# Patient Record
Sex: Female | Born: 1955 | Race: Black or African American | Hispanic: No | Smoking: Never smoker
Health system: Southern US, Community
[De-identification: ages and names within clinical notes are randomized; demographics above are authoritative.]

## PROBLEM LIST (undated history)

## (undated) DIAGNOSIS — M1990 Unspecified osteoarthritis, unspecified site: Secondary | ICD-10-CM

## (undated) DIAGNOSIS — I10 Essential (primary) hypertension: Secondary | ICD-10-CM

## (undated) DIAGNOSIS — T8859XA Other complications of anesthesia, initial encounter: Secondary | ICD-10-CM

## (undated) DIAGNOSIS — Z9889 Other specified postprocedural states: Secondary | ICD-10-CM

## (undated) DIAGNOSIS — Z87442 Personal history of urinary calculi: Secondary | ICD-10-CM

## (undated) DIAGNOSIS — Z96642 Presence of left artificial hip joint: Secondary | ICD-10-CM

## (undated) HISTORY — PX: ABDOMINAL HYSTERECTOMY: SHX81

## (undated) HISTORY — DX: Presence of left artificial hip joint: Z96.642

---

## 2009-07-08 DIAGNOSIS — Z96642 Presence of left artificial hip joint: Secondary | ICD-10-CM

## 2009-07-08 HISTORY — DX: Presence of left artificial hip joint: Z96.642

## 2011-01-27 ENCOUNTER — Inpatient Hospital Stay: Payer: Self-pay | Admitting: Internal Medicine

## 2011-02-01 ENCOUNTER — Emergency Department: Payer: Self-pay | Admitting: Emergency Medicine

## 2012-09-02 ENCOUNTER — Ambulatory Visit: Payer: Self-pay

## 2012-12-14 ENCOUNTER — Emergency Department: Payer: Self-pay | Admitting: Emergency Medicine

## 2012-12-14 LAB — CBC
HCT: 41.6 % (ref 35.0–47.0)
HGB: 14.3 g/dL (ref 12.0–16.0)
MCH: 26.8 pg (ref 26.0–34.0)
MCHC: 34.4 g/dL (ref 32.0–36.0)
MCV: 78 fL — ABNORMAL LOW (ref 80–100)
Platelet: 245 10*3/uL (ref 150–440)
RBC: 5.34 10*6/uL — ABNORMAL HIGH (ref 3.80–5.20)
RDW: 13.8 % (ref 11.5–14.5)
WBC: 6.4 10*3/uL (ref 3.6–11.0)

## 2012-12-14 LAB — URINALYSIS, COMPLETE
Bacteria: NONE SEEN
Bilirubin,UR: NEGATIVE
Blood: NEGATIVE
Glucose,UR: NEGATIVE mg/dL (ref 0–75)
Ketone: NEGATIVE
Leukocyte Esterase: NEGATIVE
Nitrite: NEGATIVE
Ph: 9 (ref 4.5–8.0)
Protein: NEGATIVE
RBC,UR: <1 /HPF (ref 0–5)
Specific Gravity: 1.005 (ref 1.003–1.030)
Squamous Epithelial: <1
WBC UR: 1 /HPF (ref 0–5)

## 2012-12-14 LAB — COMPREHENSIVE METABOLIC PANEL
Albumin: 3.9 g/dL (ref 3.4–5.0)
Alkaline Phosphatase: 112 U/L (ref 50–136)
Anion Gap: 9 (ref 7–16)
BUN: 12 mg/dL (ref 7–18)
Bilirubin,Total: 0.7 mg/dL (ref 0.2–1.0)
Calcium, Total: 9.6 mg/dL (ref 8.5–10.1)
Chloride: 110 mmol/L — ABNORMAL HIGH (ref 98–107)
Co2: 23 mmol/L (ref 21–32)
Creatinine: 0.83 mg/dL (ref 0.60–1.30)
EGFR (African American): >60
EGFR (Non-African Amer.): >60
Glucose: 102 mg/dL — ABNORMAL HIGH (ref 65–99)
Osmolality: 283 (ref 275–301)
Potassium: 3.4 mmol/L — ABNORMAL LOW (ref 3.5–5.1)
SGOT(AST): 19 U/L (ref 15–37)
SGPT (ALT): 14 U/L (ref 12–78)
Sodium: 142 mmol/L (ref 136–145)
Total Protein: 8 g/dL (ref 6.4–8.2)

## 2012-12-14 LAB — TROPONIN I
Troponin-I: <0.02 ng/mL
Troponin-I: <0.02 ng/mL

## 2013-03-02 ENCOUNTER — Other Ambulatory Visit: Payer: Self-pay | Admitting: *Deleted

## 2013-07-08 HISTORY — PX: JOINT REPLACEMENT: SHX530

## 2013-09-03 ENCOUNTER — Ambulatory Visit: Payer: Self-pay | Admitting: Family Medicine

## 2013-12-14 ENCOUNTER — Ambulatory Visit: Payer: Self-pay | Admitting: Family Medicine

## 2014-04-13 DIAGNOSIS — M160 Bilateral primary osteoarthritis of hip: Secondary | ICD-10-CM | POA: Insufficient documentation

## 2014-04-13 DIAGNOSIS — M25559 Pain in unspecified hip: Secondary | ICD-10-CM

## 2014-04-13 HISTORY — DX: Bilateral primary osteoarthritis of hip: M16.0

## 2014-04-13 HISTORY — DX: Pain in unspecified hip: M25.559

## 2014-04-27 ENCOUNTER — Ambulatory Visit: Payer: Self-pay | Admitting: Orthopedic Surgery

## 2014-04-27 LAB — URINALYSIS, COMPLETE
Bilirubin,UR: NEGATIVE
Blood: NEGATIVE
Glucose,UR: NEGATIVE mg/dL (ref 0–75)
Ketone: NEGATIVE
Nitrite: NEGATIVE
Ph: 5 (ref 4.5–8.0)
Protein: NEGATIVE
RBC,UR: 1 /HPF (ref 0–5)
Specific Gravity: 1.019 (ref 1.003–1.030)
Squamous Epithelial: 4
WBC UR: 19 /HPF (ref 0–5)

## 2014-04-27 LAB — BASIC METABOLIC PANEL
Anion Gap: 7 (ref 7–16)
BUN: 16 mg/dL (ref 7–18)
Calcium, Total: 8.9 mg/dL (ref 8.5–10.1)
Chloride: 108 mmol/L — ABNORMAL HIGH (ref 98–107)
Co2: 27 mmol/L (ref 21–32)
Creatinine: 0.75 mg/dL (ref 0.60–1.30)
EGFR (African American): >60
EGFR (Non-African Amer.): >60
Glucose: 94 mg/dL (ref 65–99)
Osmolality: 284 (ref 275–301)
Potassium: 3.9 mmol/L (ref 3.5–5.1)
Sodium: 142 mmol/L (ref 136–145)

## 2014-04-27 LAB — CBC
HCT: 39.2 % (ref 35.0–47.0)
HGB: 12.3 g/dL (ref 12.0–16.0)
MCH: 26.2 pg (ref 26.0–34.0)
MCHC: 31.5 g/dL — ABNORMAL LOW (ref 32.0–36.0)
MCV: 83 fL (ref 80–100)
Platelet: 269 10*3/uL (ref 150–440)
RBC: 4.71 10*6/uL (ref 3.80–5.20)
RDW: 13.7 % (ref 11.5–14.5)
WBC: 5 10*3/uL (ref 3.6–11.0)

## 2014-04-27 LAB — PROTIME-INR
INR: 1
Prothrombin Time: 12.6 secs (ref 11.5–14.7)

## 2014-04-27 LAB — APTT: Activated PTT: 29.9 secs (ref 23.6–35.9)

## 2014-04-27 LAB — SEDIMENTATION RATE: Erythrocyte Sed Rate: 17 mm/hr (ref 0–30)

## 2014-04-27 LAB — MRSA PCR SCREENING

## 2014-05-10 ENCOUNTER — Inpatient Hospital Stay: Payer: Self-pay | Admitting: Orthopedic Surgery

## 2014-05-11 LAB — BASIC METABOLIC PANEL
Anion Gap: 7 (ref 7–16)
BUN: 10 mg/dL (ref 7–18)
Calcium, Total: 8.2 mg/dL — ABNORMAL LOW (ref 8.5–10.1)
Chloride: 107 mmol/L (ref 98–107)
Co2: 27 mmol/L (ref 21–32)
Creatinine: 0.74 mg/dL (ref 0.60–1.30)
EGFR (African American): >60
EGFR (Non-African Amer.): >60
Glucose: 102 mg/dL — ABNORMAL HIGH (ref 65–99)
Osmolality: 280 (ref 275–301)
Potassium: 3.7 mmol/L (ref 3.5–5.1)
Sodium: 141 mmol/L (ref 136–145)

## 2014-05-11 LAB — HEMOGLOBIN: HGB: 11.3 g/dL — ABNORMAL LOW (ref 12.0–16.0)

## 2014-05-11 LAB — PLATELET COUNT: Platelet: 179 10*3/uL (ref 150–440)

## 2014-05-15 ENCOUNTER — Emergency Department: Payer: Self-pay | Admitting: Internal Medicine

## 2014-05-15 ENCOUNTER — Ambulatory Visit: Payer: Self-pay | Admitting: Orthopedic Surgery

## 2014-05-15 LAB — URINALYSIS, COMPLETE
Bilirubin,UR: NEGATIVE
Blood: NEGATIVE
Glucose,UR: NEGATIVE mg/dL (ref 0–75)
Nitrite: NEGATIVE
Ph: 8 (ref 4.5–8.0)
Protein: 30
RBC,UR: 1 /HPF (ref 0–5)
Specific Gravity: 1.017 (ref 1.003–1.030)
Squamous Epithelial: 3
WBC UR: 53 /HPF (ref 0–5)

## 2014-05-15 LAB — COMPREHENSIVE METABOLIC PANEL
Albumin: 2.6 g/dL — ABNORMAL LOW (ref 3.4–5.0)
Alkaline Phosphatase: 114 U/L
Anion Gap: 12 (ref 7–16)
BUN: 12 mg/dL (ref 7–18)
Bilirubin,Total: 1.2 mg/dL — ABNORMAL HIGH (ref 0.2–1.0)
Calcium, Total: 8.6 mg/dL (ref 8.5–10.1)
Chloride: 106 mmol/L (ref 98–107)
Co2: 21 mmol/L (ref 21–32)
Creatinine: 0.71 mg/dL (ref 0.60–1.30)
EGFR (African American): >60
EGFR (Non-African Amer.): >60
Glucose: 85 mg/dL (ref 65–99)
Osmolality: 277 (ref 275–301)
Potassium: 3 mmol/L — ABNORMAL LOW (ref 3.5–5.1)
SGOT(AST): 39 U/L — ABNORMAL HIGH (ref 15–37)
SGPT (ALT): 23 U/L
Sodium: 139 mmol/L (ref 136–145)
Total Protein: 7.3 g/dL (ref 6.4–8.2)

## 2014-05-15 LAB — CBC
HCT: 31.8 % — ABNORMAL LOW (ref 35.0–47.0)
HGB: 10.7 g/dL — ABNORMAL LOW (ref 12.0–16.0)
MCH: 27.7 pg (ref 26.0–34.0)
MCHC: 33.5 g/dL (ref 32.0–36.0)
MCV: 83 fL (ref 80–100)
Platelet: 307 10*3/uL (ref 150–440)
RBC: 3.86 10*6/uL (ref 3.80–5.20)
RDW: 13.6 % (ref 11.5–14.5)
WBC: 10.1 10*3/uL (ref 3.6–11.0)

## 2014-10-27 ENCOUNTER — Ambulatory Visit
Admit: 2014-10-27 | Disposition: A | Discharge status: Home / Self Care | Payer: Self-pay | Attending: Nurse Practitioner | Admitting: Nurse Practitioner

## 2014-10-29 NOTE — Consult Note (Signed)
Brief Consult Note: Diagnosis: Right hip pain s/p THA.   Patient was seen by consultant.   Comments: 59 year old female with right hip pain s/p DA-THA. She had missed a few of her pain medicaitons at her facility and had been having increased pain. Also, the SNF was worried that her wound looked redder.  On exam, she has normal peri-incisional erythema for her timepoint out from surgery. Nothing abnormal. She has active and passive ROM of the hip from 45 of flexion to 0 of extension. Abduction to 30 and adduction to 20. No pain.   I think that this is just her being a little tender from surgery and not getting appropriate pain medicaitons. Her exam is completely benign. I think that she can continue with wBAT, AAT and resume her post-op instructions as normal. She may follow-up with Dr. Rudene Christians as scheduled.   30 minutes spent on this consult with 20 of those face to face counselling and reassuring.  Electronic Signatures: Mardene Sayer (MD)  (Signed 28-Dec-15 00:21)  Authored: Brief Consult Note   Last Updated: 28-Dec-15 00:21 by Mardene Sayer (MD)

## 2014-10-29 NOTE — Op Note (Signed)
PATIENT NAME:  Brenda Thornton, Brenda Thornton MR#:  [id number] DATE OF BIRTH:  01-Nov-1955  DATE OF PROCEDURE:  05/10/2014  PREOPERATIVE DIAGNOSIS:  Severe left hip osteoarthritis.   POSTOPERATIVE DIAGNOSIS:  Severe left hip osteoarthritis.   PROCEDURE:  Left total hip replacement, anterior approach.   SURGEON:  Laurene Footman, MD   ANESTHESIA:  Spinal.   DESCRIPTION OF PROCEDURE:  The patient was brought to the operating room, and after spinal anesthesia was obtained, the patient was placed on the operative table with the right leg on a well-padded table, left foot in the Medacta attachment. Initial C-arm view was obtained, and the hip was prepped and draped in the usual sterile fashion. After patient identification and timeout procedures were completed, incision was made centered over the greater trochanter and tensor fasciae latae muscle. Skin and subcutaneous tissue was incised and the tensor fasciae incised. The tensor muscle was retracted laterally. The deep fascia was incised and the lateral femoral circumflex vessels ligated. The anterior capsule was then exposed and a capsulotomy created. The neck cut was carried out, and the head was removed showing severe osteoarthritis. The cup acetabulum also showed eburnated bone. There were spurs laterally and medially, which were subsequently debrided after reaming and placing of the cup. Reaming was carried out sequentially up to 52 mm, at which point there was good bleeding bone. A 52 mm trial fit well and the 52 mm cup impacted into place. Next, external rotation was carried out, pubofemoral and ischiofemoral releases, and the leg was dropped into extension. Sequential broaching was carried up to a size 7 Amis stem. With trials, L-sized head was chosen. The 7 Amis stem was impacted into place. The dual mobility liner associated with the 52 mm cup was assembled with the L 28 mm head. This was impacted onto the stem and the hip was reduced and was stable to 90  degrees external rotation. X-ray showed good component position on comparison to preoperative template with removal of large spurs. The wound was thoroughly irrigated and infiltrated with 30 mL of 0.25% Sensorcaine with epinephrine. The deep fascia was repaired using a running Quill suture. A subcutaneous drain was placed followed by 2-0 Quill subcutaneously and skin staples. Xeroform, 4 x 4's, ABD, and tape were applied. The patient was sent to the recovery room in stable condition.   ESTIMATED BLOOD LOSS:  400 mL.   COMPLICATIONS:  None.   SPECIMENS:  Femoral head.   IMPLANTS:  Medacta Versafitcup DM 52 mm with liner, an L 28 mm femoral head, and a 7 standard Amis stem, all components uncemented.    ____________________________ Laurene Footman, MD [email or web address] D: 05/10/2014 18:33:21 ET T: 05/11/2014 05:22:30 ET JOB#: [id number]  cc: Laurene Footman, MD, <Dictator> Laurene Footman MD ELECTRONICALLY SIGNED 05/11/2014 8:43

## 2014-10-29 NOTE — Discharge Summary (Signed)
PATIENT NAME:  Brenda Thornton, Brenda Thornton MR#:  [id number] DATE OF BIRTH:  Feb 03, 1956  DATE OF ADMISSION:  05/10/2014 DATE OF DISCHARGE:  05/13/2014  ADMITTING DIAGNOSIS: Degenerative arthritis, left hip.  DISCHARGE DIAGNOSIS: Degenerative arthritis, left hip.   SURGERY: On 05/10/2014, had a left total hip arthroplasty.   SURGEON: Laurene Footman, M.D.   ANESTHESIA: Spinal.   ESTIMATED BLOOD LOSS: 400 mL.   DRAINS: Hemovac.   IMPLANTS: Medacta 7 AMIS stem, 52 mm Versafit cup DM, L 28 mm head.  COMPLICATIONS: None.   HISTORY: Brenda Thornton is a 59 year old female, who failed conservative measures and treatment for her left hip degenerative arthritis. She agreed to elective total hip arthroplasty.   PHYSICAL EXAMINATION:  GENERAL: Well developed, well nourished. No acute distress.  RESPIRATORY: No use of accessory muscles.  GASTROINTESTINAL: Nausea, vomiting, no. Constipation, yes. Tolerating a diet.  PHYSICAL THERAPY: 15 feet in room with walker. INCISION: Left anterior hip, no drainage. Dressing clean and intact.  NEUROVASCULAR: Intact. Homans sign negative.  MUSCULOSKELETAL: 3/5 straight leg raise, no pain with passive hip range of motion; 5/5 ankle dorsiflexion and plantarflexion. Thigh soft.   HOSPITAL COURSE: After initial admission on 05/10/2014, the patient underwent left total hip arthroplasty the same day. She had good pain control afterwards and was transported to the orthopedic floor. On postoperative day one, 05/11/2014, hemoglobin 11.3. Platelets 179,000. Hemovac output 350 mL, on room air. Physical therapy was begun on that day.   On postoperative day two, 05/12/2014, temperature maximum 101.9, improved quickly with Tylenol. No laboratories. On room air. Hemovac output 140 mL. IV, Foley and Hemovac discontinued this date.   On postoperative day 3, she is stable and ready for discharge. She had moderate pain with physical therapy.   CONDITION AT DISCHARGE: Stable.    DISPOSITION: The patient was sent home with home health physical therapy.   DISCHARGE INSTRUCTIONS: The patient will follow up with [facility] in 2 weeks for staple removal. She will do home health physical therapy and weight bear as tolerated on the bilateral lower extremities. Anterior hip precautions. Knee-high TEDs bilaterally. Regular diet.   Please see discharge instructions for complete list of discharge medications.    ____________________________ Kristofer Schaffert M. Tretha Sciara, NP amb:JT D: 05/30/2014 08:45:37 ET T: 05/30/2014 09:32:59 ET JOB#: [id number]  cc: Carmilla Granville M. Tretha Sciara, NP, <Dictator> Kem Kays Evo Aderman FNP ELECTRONICALLY SIGNED 06/09/2014 8:55

## 2014-10-31 LAB — SURGICAL PATHOLOGY

## 2016-09-03 ENCOUNTER — Other Ambulatory Visit: Payer: Self-pay | Admitting: Nurse Practitioner

## 2016-09-03 DIAGNOSIS — Z1239 Encounter for other screening for malignant neoplasm of breast: Secondary | ICD-10-CM

## 2016-09-30 ENCOUNTER — Encounter: Payer: Self-pay | Admitting: Emergency Medicine

## 2016-09-30 ENCOUNTER — Emergency Department: Payer: BLUE CROSS/BLUE SHIELD

## 2016-09-30 ENCOUNTER — Emergency Department
Admission: EM | Admit: 2016-09-30 | Discharge: 2016-09-30 | Disposition: A | Discharge status: Home / Self Care | Payer: BLUE CROSS/BLUE SHIELD | Attending: Emergency Medicine | Admitting: Emergency Medicine

## 2016-09-30 DIAGNOSIS — I10 Essential (primary) hypertension: Secondary | ICD-10-CM | POA: Diagnosis not present

## 2016-09-30 DIAGNOSIS — R079 Chest pain, unspecified: Secondary | ICD-10-CM

## 2016-09-30 HISTORY — DX: Essential (primary) hypertension: I10

## 2016-09-30 LAB — BASIC METABOLIC PANEL
Anion gap: 6 (ref 5–15)
BUN: 22 mg/dL — ABNORMAL HIGH (ref 6–20)
CO2: 25 mmol/L (ref 22–32)
Calcium: 9.3 mg/dL (ref 8.9–10.3)
Chloride: 109 mmol/L (ref 101–111)
Creatinine, Ser: 0.65 mg/dL (ref 0.44–1.00)
GFR calc Af Amer: >60 mL/min (ref >60)
GFR calc non Af Amer: >60 mL/min (ref >60)
Glucose, Bld: 95 mg/dL (ref 65–99)
Potassium: 3.7 mmol/L (ref 3.5–5.1)
Sodium: 140 mmol/L (ref 135–145)

## 2016-09-30 LAB — CBC
HCT: 41.6 % (ref 35.0–47.0)
Hemoglobin: 14 g/dL (ref 12.0–16.0)
MCH: 26.4 pg (ref 26.0–34.0)
MCHC: 33.6 g/dL (ref 32.0–36.0)
MCV: 78.5 fL — ABNORMAL LOW (ref 80.0–100.0)
Platelets: 229 10*3/uL (ref 150–440)
RBC: 5.3 MIL/uL — ABNORMAL HIGH (ref 3.80–5.20)
RDW: 14.9 % — ABNORMAL HIGH (ref 11.5–14.5)
WBC: 6.7 10*3/uL (ref 3.6–11.0)

## 2016-09-30 LAB — TROPONIN I: Troponin I: <0.03 ng/mL (ref <0.03)

## 2016-09-30 MED ORDER — ASPIRIN 81 MG PO CHEW
324.0000 mg | CHEWABLE_TABLET | Freq: Once | ORAL | Status: AC
  Administered 2016-09-30: 324 mg via ORAL
  Filled 2016-09-30: qty 4

## 2016-09-30 NOTE — ED Provider Notes (Signed)
[facility] Emergency Department Provider Note  ____________________________________________   First MD Initiated Contact with Patient 09/30/16 1543     (approximate)  I have reviewed the triage vital signs and the nursing notes.   HISTORY  Chief Complaint Chest Pain    HPI Brenda Thornton is a 61 y.o. female who only has a history of recently diagnosed hypertensionwho presents for evaluation of intermittent mild squeezing central and left-sided chest pain that has been occurring intermittently for the last 5 days.  She states that this is a new thing for her.  Nothing in particular makes it better nor worse and she does not associated specifically with exertion.  She denies recent illness.  She denies fever/chills, shortness of breath, nausea, vomiting, abdominal pain, dysuria.  She went to her PCP, the [place] [facility], today, and they started her on blood pressure but then asked her to go to the emergency department for further evaluation of the chest pain.  She does not have a cardiologist and has had no prior heart issues.  She has no history of blood clots in her legs or lungs and has not had any recent surgeries nor been on any long trips.  She is not on exogenous estrogen.  She has no first-degree relatives who have ever had heart attacks.  She has no history of diabetes, hyperlipidemia, nor tobacco use.   Past Medical History:  Diagnosis Date  . Hypertension     There are no active problems to display for this patient.   Past Surgical History:  Procedure Laterality Date  . ABDOMINAL HYSTERECTOMY      Prior to Admission medications   Not on File    Allergies Patient has no known allergies.  No family history on file.  Social History Social History  Substance Use Topics  . Smoking status: Never Smoker  . Smokeless tobacco: Never Used  . Alcohol use Yes    Review of Systems Constitutional: No fever/chills Eyes: No visual  changes. ENT: No sore throat. Cardiovascular: Intermittent squeezing chest pain for 5 days. Respiratory: Denies shortness of breath. Gastrointestinal: No abdominal pain.  No nausea, no vomiting.  No diarrhea.  No constipation. Genitourinary: Negative for dysuria. Musculoskeletal: Negative for back pain. Skin: Negative for rash. Neurological: Negative for headaches, focal weakness or numbness.  10-point ROS otherwise negative.  ____________________________________________   PHYSICAL EXAM:  ED Triage Vitals  Enc Vitals Group     BP 09/30/16 1547 (!) 169/85     Pulse Rate 09/30/16 1547 88     Resp 09/30/16 1547 18     Temp 09/30/16 1547 97.8 F (36.6 C)     Temp Source 09/30/16 1547 Oral     SpO2 09/30/16 1547 99 %     Weight 09/30/16 1120 228 lb (103.4 kg)     Height 09/30/16 1120 5\' 5"  (1.651 m)     Head Circumference --      Peak Flow --      Pain Score 09/30/16 1119 5     Pain Loc --      Pain Edu? --      Excl. in [place]? --      Constitutional: Alert and oriented. Well appearing and in no acute distress. Eyes: Conjunctivae are normal. PERRL. EOMI. Head: Atraumatic. Nose: No congestion/rhinnorhea. Mouth/Throat: Mucous membranes are moist. Neck: No stridor.  No meningeal signs.   Cardiovascular: Normal rate, regular rhythm. Good peripheral circulation. Grossly normal heart sounds. No reproducible chest wall  tenderness to palpation Respiratory: Normal respiratory effort.  No retractions. Lungs CTAB. Gastrointestinal: Soft and nontender. No distention.  Musculoskeletal: No lower extremity tenderness nor edema. No gross deformities of extremities. Neurologic:  Normal speech and language. No gross focal neurologic deficits are appreciated.  Skin:  Skin is warm, dry and intact. No rash noted. Psychiatric: Mood and affect are normal. Speech and behavior are normal.  ____________________________________________   LABS (all labs ordered are listed, but only abnormal results  are displayed)  Labs Reviewed  BASIC METABOLIC PANEL - Abnormal; Notable for the following:       Result Value   BUN 22 (*)    All other components within normal limits  CBC - Abnormal; Notable for the following:    RBC 5.30 (*)    MCV 78.5 (*)    RDW 14.9 (*)    All other components within normal limits  TROPONIN I   ____________________________________________  EKG  ED ECG REPORT I, Quantarius Genrich, the attending physician, personally viewed and interpreted this ECG.  Date: 09/30/2016 EKG Time: 11:07 Rate: 80 Rhythm: normal sinus rhythm QRS Axis: normal Intervals: normal ST/T Wave abnormalities: normal Conduction Disturbances: none Narrative Interpretation: unremarkable  ____________________________________________  RADIOLOGY   Dg Chest 2 View  Result Date: 09/30/2016 CLINICAL DATA:  Chest pain EXAM: CHEST  2 VIEW COMPARISON:  12/14/2012 FINDINGS: The heart size and mediastinal contours are within normal limits. Both lungs are clear. The visualized skeletal structures are unremarkable. IMPRESSION: No active cardiopulmonary disease. Electronically Signed   By: Kerby Moors M.D.   On: 09/30/2016 12:44    ____________________________________________   PROCEDURES  Critical Care performed: No   Procedure(s) performed:   Procedures   ____________________________________________   INITIAL IMPRESSION / ASSESSMENT AND PLAN / ED COURSE  Pertinent labs & imaging results that were available during my care of the patient were reviewed by me and considered in my medical decision making (see chart for details).  HEART score is 3 including subjectively "moderately suspicious" history.  Wells Score for PE is zero, and she has no tachycardia, no hypoxemia, and no shortness of breath.  She is low enough risk for PE that I do not believe a d-dimer would be helpful and likely would send this down the wrong pathway.  Chest x-ray is unremarkable and she has no infectious  symptoms. .  Anginal chest discomfort is most likely, I suspect, but there is no sign of ACS at this point.  I discussed the fact that she is low risk and appropriate for outpatient cardiology follow up.  Given that the symptoms have been present for five days and troponin is negative, and the discomfort is actually better today, there is no indication for a repeat troponin.  Gave full dose ASA, gave close follow up information for Dr. Fletcher Anon.    I gave my usual and customary return precautions.      Clinical Course as of Oct 01 1623  Mon Sep 30, 2016  1623 I spoke by phone with Dr. Fletcher Anon who took down the patient's information and said he will have his office staff reach out to her for an appointment this week.  I updated the patient with this plan.  [CF]    Clinical Course User Index [CF] Hinda Kehr, MD    ____________________________________________  FINAL CLINICAL IMPRESSION(S) / ED DIAGNOSES  Final diagnoses:  Chest pain, unspecified type     MEDICATIONS GIVEN DURING THIS VISIT:  Medications  aspirin chewable tablet 324 mg (  324 mg Oral Given 09/30/16 1558)     NEW OUTPATIENT MEDICATIONS STARTED DURING THIS VISIT:  New Prescriptions   No medications on file    Modified Medications   No medications on file    Discontinued Medications   No medications on file     Note:  This document was prepared using Dragon voice recognition software and may include unintentional dictation errors.    Hinda Kehr, MD 09/30/16 [phone]

## 2016-09-30 NOTE — Discharge Instructions (Signed)

## 2016-09-30 NOTE — ED Triage Notes (Signed)
Squeezing chest pain on and off since Thursday.  Her doctor sent her here.

## 2016-10-14 NOTE — Progress Notes (Signed)
Cardiology Office Note   Date:  10/15/2016   ID:  Brenda Thornton, Brenda Thornton Dec 21, 1955, MRN [id number]  Referring Doctor:  [facility]      [place], [place]  Cardiologist:   Wende Bushy, MD   Reason for consultation:  Chief Complaint  Patient presents with  . other    NP. TCM seen in ED for CP 3-26. Pt states she is having chest pain today and is currently under a lot of stress. Reviewed meds with pt verbally.      History of Present Illness: Brenda Thornton is a 61 y.o. female who presents for CP  Couple of weeks of sudden onset CP, described as discomfort in chest, nonradiating, 4-5/10, lasts a few minutes at a time, not predictable, assoc with some SOB, also with stress  Lately BP has been high and was started by PCP on amlodipine. BP still running 160s/80s at home.  No pnd, orthopnea, edema, palpitations, syncope.    ROS:  Please see the history of present illness. Aside from mentioned under HPI, all other systems are reviewed and negative.     Past Medical History:  Diagnosis Date  . History of hip replacement, total, left 2011  . Hypertension     Past Surgical History:  Procedure Laterality Date  . ABDOMINAL HYSTERECTOMY       reports that she has never smoked. She has never used smokeless tobacco. She reports that she drinks alcohol.   family history includes Breast cancer in her paternal aunt; Leukemia in her mother; Lung cancer in her father.   No outpatient prescriptions prior to visit.   No facility-administered medications prior to visit.      Allergies: Patient has no known allergies.    PHYSICAL EXAM: VS:  BP (!) 160/100 (BP Location: Right Arm, Patient Position: Sitting, Cuff Size: Normal)   Pulse 82   Ht 5\' 5"  (1.651 m)   Wt 230 lb 8 oz (104.6 kg)   BMI 38.36 kg/m  , Body mass index is 38.36 kg/m. Wt Readings from Last 3 Encounters:  10/15/16 230 lb 8 oz (104.6 kg)  09/30/16 228 lb (103.4 kg)    GENERAL:  well  developed, well nourished, not in acute distress HEENT: normocephalic, pink conjunctivae, anicteric sclerae, no xanthelasma, normal dentition, oropharynx clear NECK:  no neck vein engorgement, JVP normal, no hepatojugular reflux, carotid upstroke brisk and symmetric, no bruit, no thyromegaly, no lymphadenopathy LUNGS:  good respiratory effort, clear to auscultation bilaterally CV:  PMI not displaced, no thrills, no lifts, S1 and S2 within normal limits, no palpable S3 or S4, no murmurs, no rubs, no gallops ABD:  Soft, nontender, nondistended, normoactive bowel sounds, no abdominal aortic bruit, no hepatomegaly, no splenomegaly MS: nontender back, no kyphosis, no scoliosis, no joint deformities EXT:  2+ DP/PT pulses, no edema, no varicosities, no cyanosis, no clubbing SKIN: warm, nondiaphoretic, normal turgor, no ulcers NEUROPSYCH: alert, oriented to person, place, and time, sensory/motor grossly intact, normal mood, appropriate affect  Recent Labs: 09/30/2016: BUN 22; Creatinine, Ser 0.65; Hemoglobin 14.0; Platelets 229; Potassium 3.7; Sodium 140   Lipid Panel No results found for: CHOL, TRIG, HDL, CHOLHDL, VLDL, LDLCALC, LDLDIRECT   Other studies Reviewed:  EKG:  The ekg from 10/15/2016 was personally reviewed by me and it revealed SR 82bpm.  Additional studies/ records that were reviewed personally reviewed by me today include:  None available  ASSESSMENT AND PLAN: CP SOB Rec echo and lexiscan stres test,  pt unable to walk on treadmill, hx of hip replacement.   HTN Increase amlodipine to 01mg  po qd. Pt has ffup with pcp today. rec echo.  Lifestyle changes, sodium restriction recommended. may consider chlorthalidone/ace/arb for additional medicaltherapyif necessary. BP goal <140/80.   Obesity Body mass index is 38.36 kg/m. Recommend aggressive weight loss through diet and increased physical activity. Once cardiac work up is completed   Current medicines are reviewed at length  with the patient today.  The patient does not have concerns regarding medicines.  Labs/ tests ordered today include:  Orders Placed This Encounter  Procedures  . NM Myocar Multi W/Spect W/Wall Motion / EF  . EKG 12-Lead  . ECHOCARDIOGRAM COMPLETE    I had a lengthy and detailed discussion with the patient regarding diagnoses, prognosis, diagnostic options, treatment options , and side effects of medications.   I counseled the patient on importance of lifestyle modification including heart healthy diet, regular physical activity once cardiac work up is completed. .   Disposition:   FU with Cardiology after tests   Thank you for this consultation. We will forwarding this consultation to referring physician.   Signed, Wende Bushy, MD  10/15/2016 2:07 PM    [place]  This note was generated in part with voice recognition software and I apologize for any typographical errors that were not detected and corrected.

## 2016-10-15 ENCOUNTER — Ambulatory Visit
Admission: RE | Admit: 2016-10-15 | Discharge: 2016-10-15 | Disposition: A | Discharge status: Home / Self Care | Payer: BLUE CROSS/BLUE SHIELD | Source: Ambulatory Visit | Attending: Nurse Practitioner | Admitting: Nurse Practitioner

## 2016-10-15 ENCOUNTER — Encounter: Payer: Self-pay | Admitting: Cardiology

## 2016-10-15 ENCOUNTER — Ambulatory Visit (INDEPENDENT_AMBULATORY_CARE_PROVIDER_SITE_OTHER): Payer: BLUE CROSS/BLUE SHIELD | Admitting: Cardiology

## 2016-10-15 VITALS — BP 160/100 | HR 82 | Ht 65.0 in | Wt 230.5 lb

## 2016-10-15 DIAGNOSIS — E6609 Other obesity due to excess calories: Secondary | ICD-10-CM

## 2016-10-15 DIAGNOSIS — R0602 Shortness of breath: Secondary | ICD-10-CM

## 2016-10-15 DIAGNOSIS — Z1239 Encounter for other screening for malignant neoplasm of breast: Secondary | ICD-10-CM | POA: Diagnosis not present

## 2016-10-15 DIAGNOSIS — R079 Chest pain, unspecified: Secondary | ICD-10-CM

## 2016-10-15 DIAGNOSIS — I10 Essential (primary) hypertension: Secondary | ICD-10-CM | POA: Diagnosis not present

## 2016-10-15 DIAGNOSIS — Z6838 Body mass index (BMI) 38.0-38.9, adult: Secondary | ICD-10-CM

## 2016-10-15 DIAGNOSIS — Z1231 Encounter for screening mammogram for malignant neoplasm of breast: Secondary | ICD-10-CM | POA: Insufficient documentation

## 2016-10-15 MED ORDER — AMLODIPINE BESYLATE 10 MG PO TABS: 10.0000 mg | ORAL_TABLET | Freq: Every day | ORAL | 6 refills | Status: DC

## 2016-10-15 NOTE — Addendum Note (Signed)
Addended by: Valora Corporal on: 10/15/2016 03:11 PM   Modules accepted: Orders

## 2016-10-15 NOTE — Patient Instructions (Addendum)
Medication Instructions:  Your physician has recommended you make the following change in your medication:  1. INCREASE Amlodipine to 10 mg Once daily. You may take 2 tablets of your current 5 mg tablets and when those run out your new prescription is for 1 tablet of 10 mg once daily.   Testing/Procedures: Your physician has requested that you have an echocardiogram. Echocardiography is a painless test that uses sound waves to create images of your heart. It provides your doctor with information about the size and shape of your heart and how well your heart's chambers and valves are working. This procedure takes approximately one hour. There are no restrictions for this procedure.  Brenda Thornton  Your caregiver has ordered a Stress Test with nuclear imaging. The purpose of this test is to evaluate the blood supply to your heart muscle. This procedure is referred to as a "Non-Invasive Stress Test." This is because other than having an IV started in your vein, nothing is inserted or "invades" your body. Cardiac stress tests are done to find areas of poor blood flow to the heart by determining the extent of coronary artery disease (CAD). Some patients exercise on a treadmill, which naturally increases the blood flow to your heart, while others who are  unable to walk on a treadmill due to physical limitations have a pharmacologic/chemical stress agent called Lexiscan . This medicine will mimic walking on a treadmill by temporarily increasing your coronary blood flow.   Please note: these test may take anywhere between 2-4 hours to complete  PLEASE REPORT TO [place] AT THE FIRST DESK WILL DIRECT YOU WHERE TO GO  Date of Procedure:_Tuesday October 22, 2016 at 07:30 AM__  Arrival Time for Procedure:_Arrive at 07:15AM to register_   PLEASE NOTIFY THE OFFICE AT LEAST 24 HOURS IN ADVANCE IF YOU ARE UNABLE TO [place].  [phone] AND  PLEASE NOTIFY  NUCLEAR MEDICINE AT [facility] AT LEAST 24 HOURS IN ADVANCE IF YOU ARE UNABLE TO KEEP YOUR APPOINTMENT. [phone]  How to prepare for your Myoview test:  1. Do not eat or drink after midnight 2. No caffeine for 24 hours prior to test 3. No smoking 24 hours prior to test. 4. Your medication may be taken with water.  If your doctor stopped a medication because of this test, do not take that medication. 5. Ladies, please do not wear dresses.  Skirts or pants are appropriate. Please wear a short sleeve shirt. 6. No perfume, cologne or lotion. 7. Wear comfortable walking shoes. No heels!   Follow-Up: Your physician recommends that you schedule a follow-up appointment as needed. We will call you with results and if needed schedule follow up at that time.    It was a pleasure seeing you today here in the office. Please do not hesitate to give [place] a call back if you have any further questions. [name], [name]     Echocardiogram An echocardiogram, or echocardiography, uses sound waves (ultrasound) to produce an image of your heart. The echocardiogram is simple, painless, obtained within a short period of time, and offers valuable information to your health care provider. The images from an echocardiogram can provide information such as:  Evidence of coronary artery disease (CAD).  Heart size.  Heart muscle function.  Heart valve function.  Aneurysm detection.  Evidence of a past heart attack.  Fluid buildup around the heart.  Heart muscle thickening.  Assess heart valve function.  Tell a health care provider about:  Any allergies you have.  All medicines you are taking, including vitamins, herbs, eye drops, creams, and over-the-counter medicines.  Any problems you or family members have had with anesthetic medicines.  Any blood disorders you have.  Any surgeries you have had.  Any medical conditions you have.  Whether you are pregnant or may be  pregnant. What happens before the procedure? No special preparation is needed. Eat and drink normally. What happens during the procedure?  In order to produce an image of your heart, gel will be applied to your chest and a wand-like tool (transducer) will be moved over your chest. The gel will help transmit the sound waves from the transducer. The sound waves will harmlessly bounce off your heart to allow the heart images to be captured in real-time motion. These images will then be recorded.  You may need an IV to receive a medicine that improves the quality of the pictures. What happens after the procedure? You may return to your normal schedule including diet, activities, and medicines, unless your health care provider tells you otherwise. This information is not intended to replace advice given to you by your health care provider. Make sure you discuss any questions you have with your health care provider. Document Released: 06/21/2000 Document Revised: 02/10/2016 Document Reviewed: 03/01/2013 Elsevier Interactive Patient Education  2017 [employer]. Pharmacologic Stress Electrocardiogram Introduction A pharmacologic stress electrocardiogram is a heart (cardiac) test that uses nuclear imaging to evaluate the blood supply to your heart. This test may also be called a pharmacologic stress electrocardiography. Pharmacologic means that a medicine is used to increase your heart rate and blood pressure. This stress test is done to find areas of poor blood flow to the heart by determining the extent of coronary artery disease (CAD). Some people exercise on a treadmill, which naturally increases the blood flow to the heart. For those people unable to exercise on a treadmill, a medicine is used. This medicine stimulates your heart and will cause your heart to beat harder and more quickly, as if you were exercising. Pharmacologic stress tests can help determine:  The adequacy of blood flow to your  heart during increased levels of activity in order to clear you for discharge home.  The extent of coronary artery blockage caused by CAD.  Your prognosis if you have suffered a heart attack.  The effectiveness of cardiac procedures done, such as an angioplasty, which can increase the circulation in your coronary arteries.  Causes of chest pain or pressure. LET [facility] CARE PROVIDER KNOW ABOUT:  Any allergies you have.  All medicines you are taking, including vitamins, herbs, eye drops, creams, and over-the-counter medicines.  Previous problems you or members of your family have had with the use of anesthetics.  Any blood disorders you have.  Previous surgeries you have had.  Medical conditions you have.  Possibility of pregnancy, if this applies.  If you are currently breastfeeding. RISKS AND COMPLICATIONS Generally, this is a safe procedure. However, as with any procedure, complications can occur. Possible complications include:  You develop pain or pressure in the following areas:  Chest.  Jaw or neck.  Between your shoulder blades.  Radiating down your left arm.  Headache.  Dizziness or light-headedness.  Shortness of breath.  Increased or irregular heartbeat.  Low blood pressure.  Nausea or vomiting.  Flushing.  Redness going up the arm and slight pain during injection of medicine.  Heart attack (rare). BEFORE  THE PROCEDURE  Avoid all forms of caffeine for 24 hours before your test or as directed by your health care provider. This includes coffee, tea (even decaffeinated tea), caffeinated sodas, chocolate, cocoa, and certain pain medicines.  Follow your health care provider's instructions regarding eating and drinking before the test.  Take your medicines as directed at regular times with water unless instructed otherwise. Exceptions may include:  If you have diabetes, ask how you are to take your insulin or pills. It is common to adjust  insulin dosing the morning of the test.  If you are taking beta-blocker medicines, it is important to talk to your health care provider about these medicines well before the date of your test. Taking beta-blocker medicines may interfere with the test. In some cases, these medicines need to be changed or stopped 24 hours or more before the test.  If you wear a nitroglycerin patch, it may need to be removed prior to the test. Ask your health care provider if the patch should be removed before the test.  If you use an inhaler for any breathing condition, bring it with you to the test.  If you are an outpatient, bring a snack so you can eat right after the stress phase of the test.  Do not smoke for 4 hours prior to the test or as directed by your health care provider.  Do not apply lotions, powders, creams, or oils on your chest prior to the test.  Wear comfortable shoes and clothing. Let your health care provider know if you were unable to complete or follow the preparations for your test. PROCEDURE  Multiple patches (electrodes) will be put on your chest. If needed, small areas of your chest may be shaved to get better contact with the electrodes. Once the electrodes are attached to your body, multiple wires will be attached to the electrodes, and your heart rate will be monitored.  An IV access will be started. A nuclear trace (isotope) is given. The isotope may be given intravenously, or it may be swallowed. Nuclear refers to several types of radioactive isotopes, and the nuclear isotope lights up the arteries so that the nuclear images are clear. The isotope is absorbed by your body. This results in low radiation exposure.  A resting nuclear image is taken to show how your heart functions at rest.  A medicine is given through the IV access.  A second scan is done about 1 hour after the medicine injection and determines how your heart functions under stress.  During this stress phase,  you will be connected to an electrocardiogram machine. Your blood pressure and oxygen levels will be monitored. What to expect after the procedure  Your heart rate and blood pressure will be monitored after the test.  You may return to your normal schedule, including diet,activities, and medicines, unless your health care provider tells you otherwise. This information is not intended to replace advice given to you by your health care provider. Make sure you discuss any questions you have with your health care provider. Document Released: 11/10/2008 Document Revised: 11/30/2015 Document Reviewed: 01/01/2016 Elsevier Interactive Patient Education  2017 [employer].

## 2016-10-17 ENCOUNTER — Ambulatory Visit (INDEPENDENT_AMBULATORY_CARE_PROVIDER_SITE_OTHER): Payer: BLUE CROSS/BLUE SHIELD

## 2016-10-17 ENCOUNTER — Other Ambulatory Visit: Payer: Self-pay

## 2016-10-17 DIAGNOSIS — R079 Chest pain, unspecified: Secondary | ICD-10-CM | POA: Diagnosis not present

## 2016-10-17 DIAGNOSIS — R0602 Shortness of breath: Secondary | ICD-10-CM

## 2016-10-22 ENCOUNTER — Ambulatory Visit
Admission: RE | Admit: 2016-10-22 | Discharge: 2016-10-22 | Disposition: A | Discharge status: Home / Self Care | Payer: BLUE CROSS/BLUE SHIELD | Source: Ambulatory Visit | Attending: Cardiology | Admitting: Cardiology

## 2016-10-22 DIAGNOSIS — R0602 Shortness of breath: Secondary | ICD-10-CM

## 2016-10-22 DIAGNOSIS — R079 Chest pain, unspecified: Secondary | ICD-10-CM | POA: Diagnosis not present

## 2016-10-22 LAB — NM MYOCAR MULTI W/SPECT W/WALL MOTION / EF
Estimated workload: 1 METS
Exercise duration (min): 0 min
Exercise duration (sec): 0 s
LV dias vol: 64 mL (ref 46–106)
LV sys vol: 18 mL
MPHR: 160 {beats}/min
Peak HR: 113 {beats}/min
Percent HR: 73 %
Percent of predicted max HR: 70 %
Rest HR: 89 {beats}/min
Stage 1 Grade: 0 %
Stage 1 HR: 92 {beats}/min
Stage 1 Speed: 0 mph
Stage 2 Grade: 0 %
Stage 2 HR: 92 {beats}/min
Stage 2 Speed: 0 mph
Stage 3 Grade: 0 %
Stage 3 HR: 113 {beats}/min
Stage 3 Speed: 0 mph
Stage 4 Grade: 0 %
Stage 4 HR: 116 {beats}/min
Stage 4 Speed: 0 mph
Stage 5 DBP: 74 mmHg
Stage 5 Grade: 0 %
Stage 5 HR: 104 {beats}/min
Stage 5 SBP: 150 mmHg
Stage 5 Speed: 0 mph
TID: 1.13

## 2016-10-22 MED ORDER — TECHNETIUM TC 99M TETROFOSMIN IV KIT
13.0000 | PACK | Freq: Once | INTRAVENOUS | Status: AC | PRN
  Administered 2016-10-22: 12.67 via INTRAVENOUS

## 2016-10-22 MED ORDER — REGADENOSON 0.4 MG/5ML IV SOLN
0.4000 mg | Freq: Once | INTRAVENOUS | Status: AC
  Administered 2016-10-22: 0.4 mg via INTRAVENOUS

## 2016-10-22 MED ORDER — TECHNETIUM TC 99M TETROFOSMIN IV KIT
31.6340 | PACK | Freq: Once | INTRAVENOUS | Status: AC | PRN
  Administered 2016-10-22: 31.634 via INTRAVENOUS

## 2016-12-12 ENCOUNTER — Other Ambulatory Visit: Payer: Self-pay | Admitting: Nurse Practitioner

## 2016-12-12 DIAGNOSIS — R600 Localized edema: Secondary | ICD-10-CM

## 2016-12-16 ENCOUNTER — Ambulatory Visit: Payer: BLUE CROSS/BLUE SHIELD

## 2016-12-17 ENCOUNTER — Ambulatory Visit
Admission: RE | Admit: 2016-12-17 | Discharge: 2016-12-17 | Disposition: A | Discharge status: Home / Self Care | Payer: BLUE CROSS/BLUE SHIELD | Source: Ambulatory Visit | Attending: Nurse Practitioner | Admitting: Nurse Practitioner

## 2016-12-17 DIAGNOSIS — M7989 Other specified soft tissue disorders: Secondary | ICD-10-CM | POA: Insufficient documentation

## 2016-12-17 DIAGNOSIS — R600 Localized edema: Secondary | ICD-10-CM

## 2017-03-24 ENCOUNTER — Ambulatory Visit
Admission: RE | Admit: 2017-03-24 | Discharge: 2017-03-24 | Disposition: A | Discharge status: Home / Self Care | Payer: Disability Insurance | Source: Ambulatory Visit | Attending: Pediatrics | Admitting: Pediatrics

## 2017-03-24 ENCOUNTER — Other Ambulatory Visit: Payer: Self-pay | Admitting: Pediatrics

## 2017-03-24 DIAGNOSIS — M1611 Unilateral primary osteoarthritis, right hip: Secondary | ICD-10-CM | POA: Insufficient documentation

## 2017-03-24 DIAGNOSIS — M545 Low back pain: Secondary | ICD-10-CM | POA: Diagnosis present

## 2017-03-24 DIAGNOSIS — M25551 Pain in right hip: Secondary | ICD-10-CM | POA: Diagnosis present

## 2017-03-24 DIAGNOSIS — M4307 Spondylolysis, lumbosacral region: Secondary | ICD-10-CM | POA: Diagnosis not present

## 2017-03-24 DIAGNOSIS — N200 Calculus of kidney: Secondary | ICD-10-CM | POA: Insufficient documentation

## 2017-03-24 DIAGNOSIS — M79606 Pain in leg, unspecified: Secondary | ICD-10-CM

## 2017-09-01 ENCOUNTER — Ambulatory Visit: Payer: Self-pay | Attending: Oncology | Admitting: *Deleted

## 2017-09-01 VITALS — BP 145/100 | HR 93 | Temp 98.0°F | Ht 67.5 in | Wt 229.0 lb

## 2017-09-01 DIAGNOSIS — N644 Mastodynia: Secondary | ICD-10-CM

## 2017-09-01 NOTE — Progress Notes (Signed)
Subjective:     Patient ID: Brenda Thornton, female   DOB: May 16, 1956, 62 y.o.   MRN: [id number]  HPI   Review of Systems     Objective:   Physical Exam  Pulmonary/Chest: Right breast exhibits skin change. Right breast exhibits no inverted nipple, no mass, no nipple discharge and no tenderness. Left breast exhibits skin change. Left breast exhibits no inverted nipple, no mass, no nipple discharge and no tenderness. Breasts are symmetrical.         Assessment:     62 year old Black female referred to [facility] by Elisabeth Cara, NP for red itching painful right breast.  The patient states she noticed the area of itching about a month ago and went to see the doctor.  States she has been using a cream for what the doctor said is ringworm.  States the redness at her lateral right breast is gone, and the pain is better, but she still has the lesions at bilateral breast.  On visual inspection of the breast there is an approximate 2 cm ringworm like lesion at 11-12:00 right breast and an approximate 1 cm ringworm like lesion at 1:00 left breast.  There is an approximate 5 cm outer ring of redness around the lesion on the right breast.  Notable tenderness bilateral at the lateral breast.  I can palpate tender fibroglandular like tissue with greater tissue in the right.  There is no dominant mass, inflammatory like skin changes, nipple discharge or lymphadenopathy.  I did confirm with the [facility] that the patient could not have her mammogram with ringworm.  She will have to wait until it is completely healed.  Blood pressure elevated at 145/100.  She is to recheck her blood pressure at [facility] or [facility], and if remains higher than 140/90 she is to follow-up with her primary care provider.  Hand out on hypertention given to patient.Patient has been screened for eligibility.  She does not have any insurance, Medicare or Medicaid.  She also meets financial eligibility.  Hand-out given on the  Affordable Care Act. Plan:      Since there is no dominant mass or inflammatory like skin changes, I have scheduled the patient to return on October 13, 2017 @ 8:00.  I will reassess her at that time and send her for her mammogram.  She is to notify Elisabeth Cara, NP if she has not had significant improvement in 2 weeks.  She is agreeable to the plan.

## 2017-09-01 NOTE — Patient Instructions (Signed)
Gave patient hand-out, Women Staying Healthy, Active and Well from [facility], with education on breast health, pap smears, heart and colon health. 

## 2017-10-06 ENCOUNTER — Telehealth: Payer: Self-pay | Admitting: *Deleted

## 2017-10-06 NOTE — Telephone Encounter (Signed)
Called patient today to see if her ringworm had resolved so I could schedule her her mammogram.  States it's getting better, but not resolved.  She using 2 different prescriptions and if it has not completely resolved in 2 weeks, she is being referred to a dermatologist.  She is to call me back in a few weeks.

## 2017-10-13 ENCOUNTER — Ambulatory Visit: Payer: Self-pay

## 2017-12-11 ENCOUNTER — Other Ambulatory Visit: Payer: Self-pay | Admitting: Nurse Practitioner

## 2017-12-11 DIAGNOSIS — Z1231 Encounter for screening mammogram for malignant neoplasm of breast: Secondary | ICD-10-CM

## 2017-12-18 ENCOUNTER — Ambulatory Visit
Admission: RE | Admit: 2017-12-18 | Discharge: 2017-12-18 | Disposition: A | Discharge status: Home / Self Care | Payer: BLUE CROSS/BLUE SHIELD | Source: Ambulatory Visit | Attending: Nurse Practitioner | Admitting: Nurse Practitioner

## 2017-12-18 DIAGNOSIS — Z1231 Encounter for screening mammogram for malignant neoplasm of breast: Secondary | ICD-10-CM | POA: Insufficient documentation

## 2017-12-30 ENCOUNTER — Encounter: Payer: Self-pay | Admitting: *Deleted

## 2017-12-30 NOTE — Progress Notes (Signed)
Patient with normal mammogram results.  Had insurance coverage for her mammogram.  She is to follow-up in one year.  HSIS to [place].

## 2018-04-29 ENCOUNTER — Other Ambulatory Visit: Payer: Self-pay | Admitting: Nurse Practitioner

## 2018-04-29 ENCOUNTER — Ambulatory Visit
Admission: RE | Admit: 2018-04-29 | Discharge: 2018-04-29 | Disposition: A | Discharge status: Home / Self Care | Payer: BLUE CROSS/BLUE SHIELD | Source: Ambulatory Visit | Attending: Nurse Practitioner | Admitting: Nurse Practitioner

## 2018-04-29 DIAGNOSIS — R059 Cough, unspecified: Secondary | ICD-10-CM

## 2018-04-29 DIAGNOSIS — R05 Cough: Secondary | ICD-10-CM

## 2018-09-23 ENCOUNTER — Inpatient Hospital Stay: Admission: RE | Admit: 2018-09-23 | Payer: BLUE CROSS/BLUE SHIELD | Source: Ambulatory Visit

## 2018-09-29 ENCOUNTER — Encounter: Admission: RE | Payer: Self-pay | Source: Home / Self Care

## 2018-09-29 ENCOUNTER — Inpatient Hospital Stay
Admission: RE | Admit: 2018-09-29 | Payer: BLUE CROSS/BLUE SHIELD | Source: Home / Self Care | Admitting: Orthopedic Surgery

## 2018-09-29 SURGERY — ARTHROPLASTY, HIP, TOTAL,POSTERIOR APPROACH
Anesthesia: Choice | Laterality: Right

## 2019-01-25 ENCOUNTER — Other Ambulatory Visit: Payer: Self-pay | Admitting: Family Medicine

## 2019-01-25 DIAGNOSIS — Z1231 Encounter for screening mammogram for malignant neoplasm of breast: Secondary | ICD-10-CM

## 2019-03-09 ENCOUNTER — Ambulatory Visit
Admission: RE | Admit: 2019-03-09 | Discharge: 2019-03-09 | Disposition: A | Discharge status: Home / Self Care | Payer: BLUE CROSS/BLUE SHIELD | Source: Ambulatory Visit | Attending: Family Medicine | Admitting: Family Medicine

## 2019-03-09 DIAGNOSIS — Z1231 Encounter for screening mammogram for malignant neoplasm of breast: Secondary | ICD-10-CM | POA: Diagnosis not present

## 2019-09-23 ENCOUNTER — Ambulatory Visit: Payer: Self-pay | Attending: Internal Medicine

## 2019-09-23 DIAGNOSIS — Z23 Encounter for immunization: Secondary | ICD-10-CM

## 2019-09-23 NOTE — Progress Notes (Signed)
   Covid-19 Vaccination Clinic  Name:  LENYX CARDINAS    MRN: [id number] DOB: [birth year]  09/23/2019  Ms. Lemarr was observed post Covid-19 immunization for 15 minutes without incident. She was provided with Vaccine Information Sheet and instruction to access the V-Safe system.   Ms. Papson was instructed to call 911 with any severe reactions post vaccine: Marland Kitchen Difficulty breathing  . Swelling of face and throat  . A fast heartbeat  . A bad rash all over body  . Dizziness and weakness   Immunizations Administered    Name Date Dose VIS Date Route   Pfizer COVID-19 Vaccine 09/23/2019 11:07 AM 0.3 mL 06/18/2019 Intramuscular   Manufacturer: [place]   Lot: [id number]   [place]: [id number]

## 2019-10-20 ENCOUNTER — Ambulatory Visit: Payer: Medicare HMO | Attending: Internal Medicine

## 2019-10-20 DIAGNOSIS — Z23 Encounter for immunization: Secondary | ICD-10-CM

## 2019-10-20 NOTE — Progress Notes (Signed)
   Covid-19 Vaccination Clinic  Name:  Brenda Thornton    MRN: [id number] DOB: April 02, 1956  10/20/2019  [name] was observed post Covid-19 immunization for 15 minutes without incident. She was provided with Vaccine Information Sheet and instruction to access the V-Safe system.   [name] was instructed to call 911 with any severe reactions post vaccine: Marland Kitchen Difficulty breathing  . Swelling of face and throat  . A fast heartbeat  . A bad rash all over body  . Dizziness and weakness   Immunizations Administered    Name Date Dose VIS Date Route   Pfizer COVID-19 Vaccine 10/20/2019  4:18 PM 0.3 mL 06/18/2019 Intramuscular   Manufacturer: [employer], [employer]   Lot: [id number]   [place]: [id number]

## 2020-02-09 ENCOUNTER — Other Ambulatory Visit: Payer: Self-pay | Admitting: Family Medicine

## 2020-02-09 DIAGNOSIS — Z1231 Encounter for screening mammogram for malignant neoplasm of breast: Secondary | ICD-10-CM

## 2020-03-09 ENCOUNTER — Other Ambulatory Visit: Payer: Self-pay

## 2020-03-09 ENCOUNTER — Ambulatory Visit
Admission: RE | Admit: 2020-03-09 | Discharge: 2020-03-09 | Disposition: A | Discharge status: Home / Self Care | Payer: Medicare HMO | Source: Ambulatory Visit | Attending: Family Medicine | Admitting: Family Medicine

## 2020-03-09 DIAGNOSIS — Z1231 Encounter for screening mammogram for malignant neoplasm of breast: Secondary | ICD-10-CM | POA: Diagnosis present

## 2020-03-14 ENCOUNTER — Other Ambulatory Visit: Payer: Self-pay | Admitting: Orthopedic Surgery

## 2020-03-17 ENCOUNTER — Encounter
Admission: RE | Admit: 2020-03-17 | Discharge: 2020-03-17 | Disposition: A | Discharge status: Home / Self Care | Payer: Medicare HMO | Source: Ambulatory Visit | Attending: Orthopedic Surgery | Admitting: Orthopedic Surgery

## 2020-03-17 ENCOUNTER — Other Ambulatory Visit: Payer: Self-pay

## 2020-03-17 DIAGNOSIS — Z01818 Encounter for other preprocedural examination: Secondary | ICD-10-CM | POA: Insufficient documentation

## 2020-03-17 HISTORY — DX: Other specified postprocedural states: Z98.890

## 2020-03-17 HISTORY — DX: Other complications of anesthesia, initial encounter: T88.59XA

## 2020-03-17 HISTORY — DX: Personal history of urinary calculi: Z87.442

## 2020-03-17 HISTORY — DX: Unspecified osteoarthritis, unspecified site: M19.90

## 2020-03-17 LAB — CBC WITH DIFFERENTIAL/PLATELET
Abs Immature Granulocytes: 0.02 10*3/uL (ref 0.00–0.07)
Basophils Absolute: 0.1 10*3/uL (ref 0.0–0.1)
Basophils Relative: 1 %
Eosinophils Absolute: 0.1 10*3/uL (ref 0.0–0.5)
Eosinophils Relative: 1 %
HCT: 41.6 % (ref 36.0–46.0)
Hemoglobin: 14.7 g/dL (ref 12.0–15.0)
Immature Granulocytes: 0 %
Lymphocytes Relative: 27 %
Lymphs Abs: 1.5 10*3/uL (ref 0.7–4.0)
MCH: 27.5 pg (ref 26.0–34.0)
MCHC: 35.3 g/dL (ref 30.0–36.0)
MCV: 77.9 fL — ABNORMAL LOW (ref 80.0–100.0)
Monocytes Absolute: 0.5 10*3/uL (ref 0.1–1.0)
Monocytes Relative: 9 %
Neutro Abs: 3.4 10*3/uL (ref 1.7–7.7)
Neutrophils Relative %: 62 %
Platelets: 281 10*3/uL (ref 150–400)
RBC: 5.34 MIL/uL — ABNORMAL HIGH (ref 3.87–5.11)
RDW: 14.5 % (ref 11.5–15.5)
WBC: 5.5 10*3/uL (ref 4.0–10.5)
nRBC: 0 % (ref 0.0–0.2)

## 2020-03-17 LAB — COMPREHENSIVE METABOLIC PANEL
ALT: 19 U/L (ref 0–44)
AST: 23 U/L (ref 15–41)
Albumin: 4.2 g/dL (ref 3.5–5.0)
Alkaline Phosphatase: 85 U/L (ref 38–126)
Anion gap: 10 (ref 5–15)
BUN: 20 mg/dL (ref 8–23)
CO2: 26 mmol/L (ref 22–32)
Calcium: 9.2 mg/dL (ref 8.9–10.3)
Chloride: 102 mmol/L (ref 98–111)
Creatinine, Ser: 0.8 mg/dL (ref 0.44–1.00)
GFR calc Af Amer: >60 mL/min (ref >60)
GFR calc non Af Amer: >60 mL/min (ref >60)
Glucose, Bld: 94 mg/dL (ref 70–99)
Potassium: 3.4 mmol/L — ABNORMAL LOW (ref 3.5–5.1)
Sodium: 138 mmol/L (ref 135–145)
Total Bilirubin: 1.1 mg/dL (ref 0.3–1.2)
Total Protein: 8 g/dL (ref 6.5–8.1)

## 2020-03-17 LAB — URINALYSIS, ROUTINE W REFLEX MICROSCOPIC
Bilirubin Urine: NEGATIVE
Glucose, UA: NEGATIVE mg/dL
Hgb urine dipstick: NEGATIVE
Ketones, ur: NEGATIVE mg/dL
Leukocytes,Ua: NEGATIVE
Nitrite: NEGATIVE
Protein, ur: NEGATIVE mg/dL
Specific Gravity, Urine: 1.021 (ref 1.005–1.030)
pH: 5 (ref 5.0–8.0)

## 2020-03-17 LAB — TYPE AND SCREEN
ABO/RH(D): O POS
Antibody Screen: NEGATIVE

## 2020-03-17 LAB — SURGICAL PCR SCREEN
MRSA, PCR: NEGATIVE
Staphylococcus aureus: NEGATIVE

## 2020-03-17 NOTE — Patient Instructions (Addendum)
Your procedure is scheduled on: 03-23-20 THURSDAY Report to Same Day Surgery 2nd floor medical mall [facility] Entrance-take elevator on left to 2nd floor.  Check in with surgery information desk.) To find out your arrival time please call [phone] between 1PM - 3PM on 03-22-20 [facility]  Remember: Instructions that are not followed completely may result in serious medical risk, up to and including death, or upon the discretion of your surgeon and anesthesiologist your surgery may need to be rescheduled.    _x___ 1. Do not eat food after midnight the night before your procedure. NO GUM OR CANDY AFTER MIDNIGHT. You may drink clear liquids up to 2 hours before you are scheduled to arrive at the hospital for your procedure.  Do not drink clear liquids within 2 hours of your scheduled arrival to the hospital.  Clear liquids include  --Water or Apple juice without pulp  --Gatorade  --Black Coffee or Clear Tea (No milk, no creamers, do not add anything to the coffee or Tea  _X___Ensure clear carbohydrate drink-FINISH DRINK 2 HOURS PRIOR TO ARRIVAL TIME TO HOSPITAL THE DAY OF YOUR SURGERY    __x__ 2. No Alcohol for 24 hours before or after surgery.   __x__3. No Smoking or e-cigarettes for 24 prior to surgery.  Do not use any chewable tobacco products for at least 6 hour prior to surgery   ____  4. Bring all medications with you on the day of surgery if instructed.    __x__ 5. Notify your doctor if there is any change in your medical condition     (cold, fever, infections).    x___6. On the morning of surgery brush your teeth with toothpaste and water.  You may rinse your mouth with mouth wash if you wish.  Do not swallow any toothpaste or mouthwash.   Do not wear jewelry, make-up, hairpins, clips or nail polish.  Do not wear lotions, powders, or perfumes.   Do not shave 48 hours prior to surgery. Men may shave face and neck.  Do not bring valuables to the hospital.    [facility] is  not responsible for any belongings or valuables.               Contacts, dentures or bridgework may not be worn into surgery.  Leave your suitcase in the car. After surgery it may be brought to your room.  For patients admitted to the hospital, discharge time is determined by your treatment team.  _  Patients discharged the day of surgery will not be allowed to drive home.  You will need someone to drive you home and stay with you the night of your procedure.    Please read over the following fact sheets that you were given:   [facility] Preparing for Surgery  ____ TAKE THE FOLLOWING MEDICATION THE MORNING OF SURGERY WITH A SMALL SIP OF WATER. These include:  1. NONE  2.  3.  4.  5.  6.  ____Fleets enema or Magnesium Citrate as directed.   _x___ Use CHG Soap as directed on instruction sheet   ____ Use inhalers on the day of surgery and bring to hospital day of surgery  ____ Stop Metformin and Janumet 2 days prior to surgery.    ____ Take 1/2 of usual insulin dose the night before surgery and none on the morning surgery.   _x___ Follow recommendations from Cardiologist, Pulmonologist or PCP regarding stopping Aspirin, Coumadin, Plavix ,Eliquis, Effient, or Pradaxa, and Pletal-STOP  ASPIRIN NOW  X____Stop Anti-inflammatories such as Advil, Aleve, Ibuprofen, Motrin, Naproxen, Naprosyn, Goodies powders, BAYER BACK AND BODY PAIN or aspirin products NOW-OK to take Tylenol    ____ Stop supplements until after surgery.   ____ Bring C-Pap to the hospital.

## 2020-03-21 ENCOUNTER — Other Ambulatory Visit: Payer: Self-pay

## 2020-03-21 ENCOUNTER — Other Ambulatory Visit
Admission: RE | Admit: 2020-03-21 | Discharge: 2020-03-21 | Disposition: A | Discharge status: Home / Self Care | Payer: Medicare HMO | Source: Ambulatory Visit | Attending: Orthopedic Surgery | Admitting: Orthopedic Surgery

## 2020-03-21 DIAGNOSIS — Z01812 Encounter for preprocedural laboratory examination: Secondary | ICD-10-CM | POA: Insufficient documentation

## 2020-03-21 DIAGNOSIS — Z20822 Contact with and (suspected) exposure to covid-19: Secondary | ICD-10-CM | POA: Insufficient documentation

## 2020-03-22 LAB — SARS CORONAVIRUS 2 (TAT 6-24 HRS): SARS Coronavirus 2: NEGATIVE

## 2020-03-23 ENCOUNTER — Ambulatory Visit: Payer: Medicare HMO | Admitting: Urgent Care

## 2020-03-23 ENCOUNTER — Encounter
Admission: RE | Disposition: A | Discharge status: Home / Self Care | Payer: Self-pay | Source: Home / Self Care | Attending: Orthopedic Surgery

## 2020-03-23 ENCOUNTER — Ambulatory Visit: Payer: Medicare HMO

## 2020-03-23 ENCOUNTER — Encounter: Payer: Self-pay | Admitting: Orthopedic Surgery

## 2020-03-23 ENCOUNTER — Other Ambulatory Visit: Payer: Self-pay

## 2020-03-23 ENCOUNTER — Ambulatory Visit
Admission: RE | Admit: 2020-03-23 | Discharge: 2020-03-23 | Disposition: A | Discharge status: Home / Self Care | Payer: Medicare HMO | Attending: Orthopedic Surgery | Admitting: Orthopedic Surgery

## 2020-03-23 DIAGNOSIS — Z419 Encounter for procedure for purposes other than remedying health state, unspecified: Secondary | ICD-10-CM

## 2020-03-23 DIAGNOSIS — M1611 Unilateral primary osteoarthritis, right hip: Secondary | ICD-10-CM | POA: Diagnosis not present

## 2020-03-23 DIAGNOSIS — Z96642 Presence of left artificial hip joint: Secondary | ICD-10-CM | POA: Insufficient documentation

## 2020-03-23 DIAGNOSIS — G8918 Other acute postprocedural pain: Secondary | ICD-10-CM

## 2020-03-23 DIAGNOSIS — Z79899 Other long term (current) drug therapy: Secondary | ICD-10-CM | POA: Diagnosis not present

## 2020-03-23 DIAGNOSIS — I10 Essential (primary) hypertension: Secondary | ICD-10-CM | POA: Insufficient documentation

## 2020-03-23 HISTORY — PX: TOTAL HIP ARTHROPLASTY: SHX124

## 2020-03-23 LAB — ABO/RH: ABO/RH(D): O POS

## 2020-03-23 SURGERY — ARTHROPLASTY, HIP, TOTAL, ANTERIOR APPROACH
Anesthesia: Spinal | Site: Hip | Laterality: Right

## 2020-03-23 MED ORDER — SODIUM CHLORIDE FLUSH 0.9 % IV SOLN
INTRAVENOUS | Status: AC
  Filled 2020-03-23: qty 40

## 2020-03-23 MED ORDER — METOCLOPRAMIDE HCL 5 MG/ML IJ SOLN
5.0000 mg | Freq: Three times a day (TID) | INTRAMUSCULAR | Status: DC | PRN
  Administered 2020-03-23: 10 mg via INTRAVENOUS

## 2020-03-23 MED ORDER — LIDOCAINE HCL (PF) 2 % IJ SOLN
INTRAMUSCULAR | Status: DC | PRN
  Administered 2020-03-23: 25 mg

## 2020-03-23 MED ORDER — ACETAMINOPHEN 325 MG PO TABS: 325.0000 mg | ORAL_TABLET | Freq: Four times a day (QID) | ORAL | Status: DC | PRN

## 2020-03-23 MED ORDER — FENTANYL CITRATE (PF) 100 MCG/2ML IJ SOLN
INTRAMUSCULAR | Status: AC
  Administered 2020-03-23: 25 ug via INTRAVENOUS
  Filled 2020-03-23: qty 2

## 2020-03-23 MED ORDER — METOCLOPRAMIDE HCL 5 MG/ML IJ SOLN
INTRAMUSCULAR | Status: AC
  Filled 2020-03-23: qty 2

## 2020-03-23 MED ORDER — ONDANSETRON HCL 4 MG/2ML IJ SOLN
INTRAMUSCULAR | Status: AC
  Filled 2020-03-23: qty 2

## 2020-03-23 MED ORDER — PROMETHAZINE HCL 25 MG/ML IJ SOLN: 6.2500 mg | Freq: Once | INTRAMUSCULAR | Status: AC

## 2020-03-23 MED ORDER — CEFAZOLIN SODIUM-DEXTROSE 2-4 GM/100ML-% IV SOLN
INTRAVENOUS | Status: AC
  Filled 2020-03-23: qty 100

## 2020-03-23 MED ORDER — GLYCOPYRROLATE 0.2 MG/ML IJ SOLN
INTRAMUSCULAR | Status: DC | PRN
  Administered 2020-03-23: .2 mg via INTRAVENOUS

## 2020-03-23 MED ORDER — MIDAZOLAM HCL 2 MG/2ML IJ SOLN
INTRAMUSCULAR | Status: AC
  Filled 2020-03-23: qty 2

## 2020-03-23 MED ORDER — CEFAZOLIN SODIUM-DEXTROSE 2-4 GM/100ML-% IV SOLN
INTRAVENOUS | Status: AC
  Administered 2020-03-23: 2 g via INTRAVENOUS
  Filled 2020-03-23: qty 100

## 2020-03-23 MED ORDER — PHENOL 1.4 % MT LIQD
1.0000 | OROMUCOSAL | Status: DC | PRN
  Filled 2020-03-23: qty 177

## 2020-03-23 MED ORDER — OXYCODONE HCL 5 MG PO TABS
5.0000 mg | ORAL_TABLET | ORAL | Status: DC | PRN
  Filled 2020-03-23: qty 2

## 2020-03-23 MED ORDER — BUPIVACAINE HCL (PF) 0.5 % IJ SOLN
INTRAMUSCULAR | Status: AC
  Filled 2020-03-23: qty 10

## 2020-03-23 MED ORDER — FENTANYL CITRATE (PF) 100 MCG/2ML IJ SOLN
INTRAMUSCULAR | Status: AC
  Filled 2020-03-23: qty 2

## 2020-03-23 MED ORDER — ACETAMINOPHEN 10 MG/ML IV SOLN: 1000.0000 mg | Freq: Once | INTRAVENOUS | Status: DC | PRN

## 2020-03-23 MED ORDER — SODIUM CHLORIDE 0.9 % IV SOLN
INTRAVENOUS | Status: DC | PRN
  Administered 2020-03-23: 60 mL

## 2020-03-23 MED ORDER — ACETAMINOPHEN 500 MG PO TABS: 1000.0000 mg | ORAL_TABLET | Freq: Four times a day (QID) | ORAL | Status: DC

## 2020-03-23 MED ORDER — ENOXAPARIN SODIUM 40 MG/0.4ML ~~LOC~~ SOLN: 40.0000 mg | SUBCUTANEOUS | 0 refills | Status: DC

## 2020-03-23 MED ORDER — ONDANSETRON HCL 4 MG PO TABS: 4.0000 mg | ORAL_TABLET | Freq: Four times a day (QID) | ORAL | Status: DC | PRN

## 2020-03-23 MED ORDER — DOCUSATE SODIUM 100 MG PO CAPS: 100.0000 mg | ORAL_CAPSULE | Freq: Two times a day (BID) | ORAL | Status: DC

## 2020-03-23 MED ORDER — CEFAZOLIN SODIUM-DEXTROSE 2-4 GM/100ML-% IV SOLN: 2.0000 g | Freq: Four times a day (QID) | INTRAVENOUS | Status: DC

## 2020-03-23 MED ORDER — ENOXAPARIN SODIUM 40 MG/0.4ML ~~LOC~~ SOLN: 40.0000 mg | SUBCUTANEOUS | Status: DC

## 2020-03-23 MED ORDER — ORAL CARE MOUTH RINSE: 15.0000 mL | Freq: Once | OROMUCOSAL | Status: AC

## 2020-03-23 MED ORDER — CHLORHEXIDINE GLUCONATE 0.12 % MT SOLN
OROMUCOSAL | Status: AC
  Administered 2020-03-23: 15 mL via OROMUCOSAL
  Filled 2020-03-23: qty 15

## 2020-03-23 MED ORDER — OXYCODONE HCL 5 MG/5ML PO SOLN: 5.0000 mg | Freq: Once | ORAL | Status: DC | PRN

## 2020-03-23 MED ORDER — METOCLOPRAMIDE HCL 10 MG PO TABS: 5.0000 mg | ORAL_TABLET | Freq: Three times a day (TID) | ORAL | Status: DC | PRN

## 2020-03-23 MED ORDER — LACTATED RINGERS IV BOLUS
500.0000 mL | Freq: Once | INTRAVENOUS | Status: AC
  Administered 2020-03-23: 500 mL via INTRAVENOUS

## 2020-03-23 MED ORDER — NEOMYCIN-POLYMYXIN B GU 40-200000 IR SOLN
Status: DC | PRN
  Administered 2020-03-23: 4 mL

## 2020-03-23 MED ORDER — PROMETHAZINE HCL 25 MG/ML IJ SOLN
INTRAMUSCULAR | Status: AC
  Administered 2020-03-23: 6.25 mg via INTRAVENOUS
  Filled 2020-03-23: qty 1

## 2020-03-23 MED ORDER — BUPIVACAINE LIPOSOME 1.3 % IJ SUSP
INTRAMUSCULAR | Status: AC
  Filled 2020-03-23: qty 20

## 2020-03-23 MED ORDER — CHLORHEXIDINE GLUCONATE 0.12 % MT SOLN: 15.0000 mL | Freq: Once | OROMUCOSAL | Status: AC

## 2020-03-23 MED ORDER — PHENYLEPHRINE HCL (PRESSORS) 10 MG/ML IV SOLN
INTRAVENOUS | Status: DC | PRN
  Administered 2020-03-23 (×4): 100 ug via INTRAVENOUS

## 2020-03-23 MED ORDER — BUPIVACAINE-EPINEPHRINE 0.25% -1:200000 IJ SOLN
INTRAMUSCULAR | Status: DC | PRN
  Administered 2020-03-23: 20 mL

## 2020-03-23 MED ORDER — TRAMADOL HCL 50 MG PO TABS: 50.0000 mg | ORAL_TABLET | Freq: Four times a day (QID) | ORAL | Status: DC

## 2020-03-23 MED ORDER — FENTANYL CITRATE (PF) 100 MCG/2ML IJ SOLN
INTRAMUSCULAR | Status: DC | PRN
Start: 2020-03-23 — End: 2020-03-23
  Administered 2020-03-23 (×2): 50 ug via INTRAVENOUS

## 2020-03-23 MED ORDER — PROPOFOL 500 MG/50ML IV EMUL
INTRAVENOUS | Status: AC
  Filled 2020-03-23: qty 50

## 2020-03-23 MED ORDER — PROPOFOL 500 MG/50ML IV EMUL
INTRAVENOUS | Status: DC | PRN
  Administered 2020-03-23: 35 ug/kg/min via INTRAVENOUS

## 2020-03-23 MED ORDER — ONDANSETRON HCL 4 MG/2ML IJ SOLN: 4.0000 mg | Freq: Four times a day (QID) | INTRAMUSCULAR | Status: DC | PRN

## 2020-03-23 MED ORDER — BUPIVACAINE HCL (PF) 0.5 % IJ SOLN
INTRAMUSCULAR | Status: DC | PRN
  Administered 2020-03-23: 2.6 mL via INTRATHECAL

## 2020-03-23 MED ORDER — LACTATED RINGERS IV SOLN: INTRAVENOUS | Status: DC

## 2020-03-23 MED ORDER — BUPIVACAINE-EPINEPHRINE (PF) 0.25% -1:200000 IJ SOLN
INTRAMUSCULAR | Status: AC
  Filled 2020-03-23: qty 30

## 2020-03-23 MED ORDER — FENTANYL CITRATE (PF) 100 MCG/2ML IJ SOLN
25.0000 ug | INTRAMUSCULAR | Status: AC | PRN
  Administered 2020-03-23 (×4): 25 ug via INTRAVENOUS

## 2020-03-23 MED ORDER — HYDROMORPHONE HCL 1 MG/ML IJ SOLN: 0.5000 mg | INTRAMUSCULAR | Status: DC | PRN

## 2020-03-23 MED ORDER — LIDOCAINE HCL (PF) 2 % IJ SOLN
INTRAMUSCULAR | Status: AC
  Filled 2020-03-23: qty 5

## 2020-03-23 MED ORDER — METHOCARBAMOL 1000 MG/10ML IJ SOLN
500.0000 mg | Freq: Four times a day (QID) | INTRAVENOUS | Status: DC | PRN
  Filled 2020-03-23: qty 5

## 2020-03-23 MED ORDER — OXYCODONE HCL 5 MG PO TABS
10.0000 mg | ORAL_TABLET | ORAL | Status: DC | PRN
  Filled 2020-03-23: qty 3

## 2020-03-23 MED ORDER — LACTATED RINGERS IV BOLUS
250.0000 mL | Freq: Once | INTRAVENOUS | Status: AC
  Administered 2020-03-23: 250 mL via INTRAVENOUS

## 2020-03-23 MED ORDER — OXYCODONE HCL 5 MG PO TABS: 5.0000 mg | ORAL_TABLET | ORAL | 0 refills | Status: AC | PRN

## 2020-03-23 MED ORDER — GLYCOPYRROLATE 0.2 MG/ML IJ SOLN
INTRAMUSCULAR | Status: AC
  Filled 2020-03-23: qty 1

## 2020-03-23 MED ORDER — MIDAZOLAM HCL 5 MG/5ML IJ SOLN
INTRAMUSCULAR | Status: DC | PRN
  Administered 2020-03-23 (×2): 2 mg via INTRAVENOUS

## 2020-03-23 MED ORDER — ONDANSETRON HCL 4 MG/2ML IJ SOLN
4.0000 mg | Freq: Once | INTRAMUSCULAR | Status: AC | PRN
  Administered 2020-03-23: 4 mg via INTRAVENOUS

## 2020-03-23 MED ORDER — METHOCARBAMOL 500 MG PO TABS: 500.0000 mg | ORAL_TABLET | Freq: Four times a day (QID) | ORAL | Status: DC | PRN

## 2020-03-23 MED ORDER — CEFAZOLIN SODIUM-DEXTROSE 2-4 GM/100ML-% IV SOLN
2.0000 g | INTRAVENOUS | Status: AC
  Administered 2020-03-23: 2 g via INTRAVENOUS

## 2020-03-23 MED ORDER — MENTHOL 3 MG MT LOZG
1.0000 | LOZENGE | OROMUCOSAL | Status: DC | PRN
  Filled 2020-03-23: qty 9

## 2020-03-23 MED ORDER — OXYCODONE HCL 5 MG PO TABS: 5.0000 mg | ORAL_TABLET | Freq: Once | ORAL | Status: DC | PRN

## 2020-03-23 SURGICAL SUPPLY — 63 items
APL PRP STRL LF DISP 70% ISPRP (MISCELLANEOUS) ×1
BLADE SAGITTAL AGGR TOOTH XLG (BLADE) ×3 IMPLANT
BNDG COHESIVE 6X5 TAN STRL LF (GAUZE/BANDAGES/DRESSINGS) ×9 IMPLANT
CANISTER SUCT 1200ML W/VALVE (MISCELLANEOUS) ×3 IMPLANT
CANISTER WOUND CARE 500ML ATS (WOUND CARE) ×3 IMPLANT
CHLORAPREP W/TINT 26 (MISCELLANEOUS) ×3 IMPLANT
COVER BACK TABLE REUSABLE LG (DRAPES) ×3 IMPLANT
COVER WAND RF STERILE (DRAPES) ×3 IMPLANT
DRAPE 3/4 80X56 (DRAPES) ×9 IMPLANT
DRAPE C-ARM XRAY 36X54 (DRAPES) ×3 IMPLANT
DRAPE INCISE IOBAN 66X60 STRL (DRAPES) IMPLANT
DRAPE POUCH INSTRU U-SHP 10X18 (DRAPES) ×3 IMPLANT
DRESSING SURGICEL FIBRLLR 1X2 (HEMOSTASIS) ×2 IMPLANT
DRSG MEPILEX SACRM 8.7X9.8 (GAUZE/BANDAGES/DRESSINGS) ×3 IMPLANT
DRSG OPSITE POSTOP 4X8 (GAUZE/BANDAGES/DRESSINGS) ×6 IMPLANT
DRSG SURGICEL FIBRILLAR 1X2 (HEMOSTASIS) ×6
ELECT BLADE 6.5 EXT (BLADE) ×3 IMPLANT
ELECT REM PT RETURN 9FT ADLT (ELECTROSURGICAL) ×3
ELECTRODE REM PT RTRN 9FT ADLT (ELECTROSURGICAL) ×1 IMPLANT
GLOVE BIOGEL PI IND STRL 9 (GLOVE) ×1 IMPLANT
GLOVE BIOGEL PI INDICATOR 9 (GLOVE) ×2
GLOVE SURG SYN 9.0  PF PI (GLOVE) ×4
GLOVE SURG SYN 9.0 PF PI (GLOVE) ×2 IMPLANT
GOWN SRG 2XL LVL 4 RGLN SLV (GOWNS) ×1 IMPLANT
GOWN STRL NON-REIN 2XL LVL4 (GOWNS) ×3
GOWN STRL REUS W/ TWL LRG LVL3 (GOWN DISPOSABLE) ×1 IMPLANT
GOWN STRL REUS W/TWL LRG LVL3 (GOWN DISPOSABLE) ×3
HEAD FEMORAL 28MM SZ M (Head) ×2 IMPLANT
HEMOVAC 400CC 10FR (MISCELLANEOUS) IMPLANT
HOLDER FOLEY CATH W/STRAP (MISCELLANEOUS) ×3 IMPLANT
HOOD PEEL AWAY FLYTE STAYCOOL (MISCELLANEOUS) ×3 IMPLANT
IRRIGATION SURGIPHOR STRL (IV SOLUTION) IMPLANT
KIT PREVENA INCISION MGT 13 (CANNISTER) ×3 IMPLANT
LINER DBL MOB SZ 0 52MM (Liner) ×2 IMPLANT
MAT ABSORB  FLUID 56X50 GRAY (MISCELLANEOUS) ×2
MAT ABSORB FLUID 56X50 GRAY (MISCELLANEOUS) ×1 IMPLANT
NDL SAFETY ECLIPSE 18X1.5 (NEEDLE) ×1 IMPLANT
NDL SPNL 20GX3.5 QUINCKE YW (NEEDLE) ×2 IMPLANT
NEEDLE HYPO 18GX1.5 SHARP (NEEDLE) ×3
NEEDLE SPNL 20GX3.5 QUINCKE YW (NEEDLE) ×6 IMPLANT
NS IRRIG 1000ML POUR BTL (IV SOLUTION) ×3 IMPLANT
PACK HIP COMPR (MISCELLANEOUS) ×3 IMPLANT
SCALPEL PROTECTED #10 DISP (BLADE) ×6 IMPLANT
SHELL ACETABULAR SZ 52 DM (Shell) ×2 IMPLANT
SOL PREP PVP 2OZ (MISCELLANEOUS) ×3
SOLUTION PREP PVP 2OZ (MISCELLANEOUS) ×1 IMPLANT
SPONGE DRAIN TRACH 4X4 STRL 2S (GAUZE/BANDAGES/DRESSINGS) ×3 IMPLANT
STAPLER SKIN PROX 35W (STAPLE) ×3 IMPLANT
STEM FEMORAL SZ7 STD COLLARED (Stem) ×2 IMPLANT
STRAP SAFETY 5IN WIDE (MISCELLANEOUS) ×3 IMPLANT
SUT DVC 2 QUILL PDO  T11 36X36 (SUTURE) ×2
SUT DVC 2 QUILL PDO T11 36X36 (SUTURE) ×1 IMPLANT
SUT SILK 0 (SUTURE) ×3
SUT SILK 0 30XBRD TIE 6 (SUTURE) ×1 IMPLANT
SUT V-LOC 90 ABS DVC 3-0 CL (SUTURE) ×3 IMPLANT
SUT VIC AB 1 CT1 36 (SUTURE) ×3 IMPLANT
SYR 20ML LL LF (SYRINGE) ×3 IMPLANT
SYR 30ML LL (SYRINGE) ×3 IMPLANT
SYR 50ML LL SCALE MARK (SYRINGE) ×6 IMPLANT
SYR BULB IRRIG 60ML STRL (SYRINGE) ×3 IMPLANT
TAPE MICROFOAM 4IN (TAPE) ×3 IMPLANT
TOWEL OR 17X26 4PK STRL BLUE (TOWEL DISPOSABLE) ×3 IMPLANT
TRAY FOLEY MTR SLVR 16FR STAT (SET/KITS/TRAYS/PACK) ×3 IMPLANT

## 2020-03-23 NOTE — Anesthesia Preprocedure Evaluation (Signed)
Anesthesia Evaluation  Patient identified by MRN, date of birth, ID band Patient awake    Reviewed: Allergy & Precautions, NPO status , Patient's Chart, lab work & pertinent test results  History of Anesthesia Complications (+) PONV and history of anesthetic complications  Airway Mallampati: II  TM Distance: >3 FB Neck ROM: Full    Dental  (+) Teeth Intact, Partial Upper, Dental Advisory Given   Pulmonary neg pulmonary ROS, neg sleep apnea, neg COPD, Patient abstained from smoking.Not current smoker,    Pulmonary exam normal breath sounds clear to auscultation       Cardiovascular Exercise Tolerance: Good METShypertension, (-) CAD and (-) Past MI (-) dysrhythmias  Rhythm:Regular Rate:Normal - Systolic murmurs    Neuro/Psych negative neurological ROS  negative psych ROS   GI/Hepatic neg GERD  ,(+)     (-) substance abuse  ,   Endo/Other  neg diabetesMorbid obesity  Renal/GU negative Renal ROS     Musculoskeletal  (+) Arthritis ,   Abdominal (+) + obese,   Peds  Hematology   Anesthesia Other Findings Past Medical History: No date: Arthritis No date: Complication of anesthesia     Comment:  hard to wake up 2011: History of hip replacement, total, left No date: History of kidney stones     Comment:  h/o No date: Hypertension No date: PONV (postoperative nausea and vomiting)     Comment:  naueseated only  Reproductive/Obstetrics                             Anesthesia Physical Anesthesia Plan  ASA: III  Anesthesia Plan: General/Spinal   Post-op Pain Management:    Induction: Intravenous  PONV Risk Score and Plan: 4 or greater and Ondansetron, Dexamethasone, Propofol infusion, TIVA and Midazolam  Airway Management Planned: Natural Airway  Additional Equipment: None  Intra-op Plan:   Post-operative Plan:   Informed Consent: I have reviewed the patients History and  Physical, chart, labs and discussed the procedure including the risks, benefits and alternatives for the proposed anesthesia with the patient or authorized representative who has indicated his/her understanding and acceptance.       Plan Discussed with: CRNA and Surgeon  Anesthesia Plan Comments: (Discussed R/B/A of neuraxial anesthesia technique with patient: - rare risks of spinal/epidural hematoma, nerve damage, infection - Risk of PDPH - Risk of nausea and vomiting - Risk of conversion to general anesthesia and its associated risks, including sore throat, damage to lips/teeth/oropharynx, and rare risks such as cardiac and respiratory events.  Patient voiced understanding.)        Anesthesia Quick Evaluation

## 2020-03-23 NOTE — Anesthesia Procedure Notes (Signed)
Spinal  Patient location during procedure: OR Staffing Performed: resident/CRNA  Anesthesiologist: Zak, Arthur, MD Resident/CRNA: Cheyanne Lamison, CRNA Preanesthetic Checklist Completed: patient identified, IV checked, site marked, risks and benefits discussed, surgical consent, monitors and equipment checked, pre-op evaluation and timeout performed Spinal Block Patient position: sitting Prep: ChloraPrep and site prepped and draped Patient monitoring: heart rate, continuous pulse ox, blood pressure and cardiac monitor Approach: midline Location: L4-5 Injection technique: single-shot Needle Needle type: Introducer and Pencan  Needle gauge: 24 G Needle length: 9 cm Additional Notes Negative paresthesia. Negative blood return. Positive free-flowing CSF. Expiration date of kit checked and confirmed. Patient tolerated procedure well, without complications.       

## 2020-03-23 NOTE — Transfer of Care (Signed)
Immediate Anesthesia Transfer of Care Note  Patient: Brenda Thornton  Procedure(s) Performed: TOTAL HIP ARTHROPLASTY ANTERIOR APPROACH (Right Hip)  Patient Location: PACU  Anesthesia Type:Spinal  Level of Consciousness: awake  Airway & Oxygen Therapy: Patient Spontanous Breathing and Patient connected to face mask oxygen  Post-op Assessment: Report given to RN and Post -op Vital signs reviewed and stable  Post vital signs: Reviewed  Last Vitals:  Vitals Value Taken Time  BP    Temp    Pulse    Resp    SpO2      Last Pain:  Vitals:   03/23/20 0822  TempSrc: Tympanic  PainSc: 8          Complications: No complications documented.

## 2020-03-23 NOTE — Evaluation (Signed)
Physical Therapy Evaluation Patient Details Name: Brenda Thornton MRN: [id number] DOB: [birth year] Today's Date: 03/23/2020   History of Present Illness  Pt is POD 0 s/p R THR. History includes L THR in 2015 and arthritis.   Clinical Impression  Pt is a pleasant 64 year old female who was admitted for R THR. Pt performs bed mobility with min assist, transfers with cga, and ambulation with cga and RW. Pt demonstrates deficits with pain/mobility. Stair training performed with safe technique. Needs RW prior to discharge. Would benefit from skilled PT to address above deficits and promote optimal return to PLOF. Currently recommending return home with HHPT. Pt is safe to dc from acute care hospital.    Follow Up Recommendations Home health PT;Supervision for mobility/OOB    Equipment Recommendations  Rolling walker with 5" wheels    Recommendations for Other Services       Precautions / Restrictions Precautions Precautions: Fall;Anterior Hip Precaution Booklet Issued: Yes (comment) Restrictions Weight Bearing Restrictions: Yes RLE Weight Bearing: Weight bearing as tolerated      Mobility  Bed Mobility Overal bed mobility: Needs Assistance Bed Mobility: Supine to Sit     Supine to sit: Min assist     General bed mobility comments: needs slight assistance bringing legs over bed and trunkal elevation  Transfers Overall transfer level: Needs assistance Equipment used: Rolling walker (2 wheeled) Transfers: Sit to/from Stand Sit to Stand: Min guard         General transfer comment: safe technique with cues for safe hand placement. Once standing upright posture noted  Ambulation/Gait Ambulation/Gait assistance: Min guard Gait Distance (Feet): 100 Feet Assistive device: Rolling walker (2 wheeled) Gait Pattern/deviations: Step-to pattern     General Gait Details: ambulated with step to gait pattern. Fatigues with increased exertion. Cues for upright  posture  Stairs Stairs: Yes Stairs assistance: Min guard Stair Management: One rail Left;One rail Right Number of Stairs: 4 General stair comments: Pt navigated stairs using B hands on 1 rail (R up) and (L down). Step to gait pattern. Demonstration performed prior to.  Wheelchair Mobility    Modified Rankin (Stroke Patients Only)       Balance Overall balance assessment: Needs assistance Sitting-balance support: Feet supported Sitting balance-Leahy Scale: Good     Standing balance support: Bilateral upper extremity supported Standing balance-Leahy Scale: Good                               Pertinent Vitals/Pain Pain Assessment: 0-10 Pain Score: 5  Pain Location: R hip Pain Descriptors / Indicators: Operative site guarding Pain Intervention(s): Limited activity within patient's tolerance;Repositioned;Ice applied;Monitored during session    [place] expects to be discharged to:: Private residence Living Arrangements: Spouse/significant other (boyfriend) Available Help at Discharge: Available 24 hours/day Type of Home: House Home Access: Stairs to enter Entrance Stairs-Rails: Right;Left (too wide to reach both) Entrance Stairs-Number of Steps: 5 Home Layout: One level Home Equipment: [place] - 4 wheels;Bedside commode      Prior Function Level of Independence: Independent         Comments: reports she wasn't using any equipment for mobility, just having increased pain. Reports no falls     Hand Dominance        Extremity/Trunk Assessment   Upper Extremity Assessment Upper Extremity Assessment: Overall WFL for tasks assessed    Lower Extremity Assessment Lower Extremity Assessment: Generalized weakness (R LE grossly 3+/5; L  LE grossly 4+/5)       Communication   Communication: No difficulties  Cognition Arousal/Alertness: Awake/alert Behavior During Therapy: WFL for tasks assessed/performed Overall Cognitive Status:  Within Functional Limits for tasks assessed                                        General Comments      Exercises Other Exercises Other Exercises: supine ther-ex performed and HEP given and reviewed. Ther-ex includes AP, quad sets, glut sets, SLRs, hip abd/add, hip add squeezes, and SAQ. All ther-ex performed x 10 reps with cues and min assist Other Exercises: Educated on wound vac system, cryo therapy, falls prevention, and adaptive equipment. Other Exercises: ambulated to bathroom with cga. Chair follow for safety. Needs min assist to transition on/off toilet. Able to perform hygiene with supervision   Assessment/Plan    PT Assessment All further PT needs can be met in the next venue of care  PT Problem List Decreased strength;Decreased balance;Decreased mobility;Decreased knowledge of precautions;Pain       PT Treatment Interventions      PT Goals (Current goals can be found in the Care Plan section)  Acute Rehab PT Goals Patient Stated Goal: to go home PT Goal Formulation: All assessment and education complete, DC therapy Time For Goal Achievement: 03/23/20 Potential to Achieve Goals: Good    Frequency     Barriers to discharge        Co-evaluation               AM-PAC PT "6 Clicks" Mobility  Outcome Measure Help needed turning from your back to your side while in a flat bed without using bedrails?: A Little Help needed moving from lying on your back to sitting on the side of a flat bed without using bedrails?: A Little Help needed moving to and from a bed to a chair (including a wheelchair)?: A Little Help needed standing up from a chair using your arms (e.g., wheelchair or bedside chair)?: A Little Help needed to walk in hospital room?: A Little Help needed climbing 3-5 steps with a railing? : A Little 6 Click Score: 18    End of Session Equipment Utilized During Treatment: Gait belt Activity Tolerance: Patient tolerated treatment  well Patient left: in chair Nurse Communication: Mobility status PT Visit Diagnosis: Muscle weakness (generalized) (M62.81);Difficulty in walking, not elsewhere classified (R26.2);Pain Pain - Right/Left: Right Pain - part of body: Hip    Time: 6384-5364 PT Time Calculation (min) (ACUTE ONLY): 57 min   Charges:   PT Evaluation $PT Eval Moderate Complexity: 1 Mod PT Treatments $Gait Training: 23-37 mins $Therapeutic Exercise: 8-22 mins $Therapeutic Activity: 8-22 mins        Greggory Stallion, PT, DPT [phone]   Laurinda Carreno 03/23/2020, 5:05 PM

## 2020-03-23 NOTE — H&P (Signed)
Chief Complaint  Patient presents with  . Right Hip - Pain   Brenda Thornton is a 64 y.o. female who presents today for evaluation of right hip pain. She has severe right hip pain located in her groin, right anterior thigh and right buttocks. Pain is increased over the last year. We will schedule for total hip arthroplasty last year but due to Covid canceled. She has severe degenerative arthritis of the right hip. She has been seen recently in the past and recommended to have total hip arthroplasty. She would like to proceed with total hip arthroplasty at this time. She is had a successful left total hip replacement performed by Dr. Rudene Christians in 2015. Patient's pain is 10 out of 10 with ambulation and 4 out of 10 at times at night. She takes occasional Tylenol for pain. No back pain numbness tingling or radicular symptoms. X-rays from January 2020 shows severe degenerative arthritis of the right hip with complete loss of joint space in the superior and central joint space.  Patient is retired. No history of diabetes mellitus. No history of blood clots.  Past Medical History: Past Medical History:  Diagnosis Date  . Arthritis  . Arthritis  . Arthritis  . Chickenpox  . Chickenpox  . Osteoarthritis  severe hip   Past Surgical History: Past Surgical History:  Procedure Laterality Date  . HYSTERECTOMY  . Left total hip replacement, anterior approach Left 05/10/14   Past Family History: Family History  Problem Relation Age of Onset  . Lymphoma Mother  . Lung cancer Father   Medications: Current Outpatient Medications Ordered in Epic  Medication Sig Dispense Refill  . cholecalciferol (VITAMIN D3) 1,000 unit capsule TAKE 2 CAPSULES BY MOUTH EVERY DAY FOR VITAMIN D DEFICIENCY  . hydroCHLOROthiazide (MICROZIDE) 12.5 mg capsule TAKE 1 CAPSULE BY MOUTH DAILY FOR HIGH BLOOD PRESSURE   No current Epic-ordered facility-administered medications on file.   Allergies: No Known Allergies   Review  of Systems:  A comprehensive 14 point ROS was performed, reviewed by me today, and the pertinent orthopaedic findings are documented in the HPI.  Exam: BP 124/86  Pulse 92  Ht 165.1 cm (5\' 5" )  Wt (!) 107.5 kg (237 lb)  SpO2 96%  BMI 39.44 kg/m  General:  Well developed, well nourished, no apparent distress, normal affect, antalgic gait  HEENT: Head normocephalic, atraumatic, PERRL.   Abdomen: Soft, non tender, non distended, Bowel sounds present.  Heart: Examination of the heart reveals regular, rate, and rhythm. There is no murmur noted on ascultation. There is a normal apical pulse.  Lungs: Lungs are clear to auscultation. There is no wheeze, rhonchi, or crackles. There is normal expansion of bilateral chest walls.   Lumbar Spine: Examination of the lumbar spine reveals no bony abnormality, no edema, and no ecchymosis. There is no step off. The patient has full range of motion of the lumbar spine with flexion and extension. The patient has normal lateral bend and rotation. The patient has no pain with range of motion activities. The patient is non tender along the spinous process. The patient is non tender along the paravertebral muscles, with no muscle spasms. The patient is non tender along the iliac crest. The patient is non tender in the sciatic notch. The patient is non tender along the Sacroiliac joint. There is no Coccyx joint tenderness.   Right lower Extremities: Examination of the right lower extremities reveals no bony abnormality, no edema, and no ecchymosis. The patient has limited right  hip internal rotation. The patient has no anterior hip joint tenderness. Patient has pain with hip flexion and internal rotation. The patient is mildly tender along the greater trochanter region. The patient has a negative Bevelyn Buckles' test bilaterally. There is normal skin warmth. There is normal capillary refill bilaterally.   Neurologic: The patient has a negative straight leg raise.  The patient has normal muscle strength testing for the quadriceps, calves, ankle dorsiflexion, ankle plantarflexion, and extensor hallicus longus. The patient has sensation that is intact to light touch. The deep tendon reflexes are normal at the patella and achilles. No clonus is noted.   AP and lateral views of the right hip are ordered interpreted by me in the office today. Impression: Patient has severe degenerative changes right hip joint with complete loss of joint space of the superior and inferior acetabulum. There is severe subchondral sclerosing and subchondral cyst formation noted along the acetabulum and femoral head. No protrusio noted. Severe spurring along the inferior and superior acetabulum.  Impression: Primary osteoarthritis of right hip [M16.11] Primary osteoarthritis of right hip (primary encounter diagnosis)  Plan:  1. Risks, benefits, complications of a right total hip arthroplasty have been discussed with the patient. Patient has agreed and consented the procedure with Dr. Rudene Christians. We discussed outpatient total joint surgery, patient states she would like to proceed with outpatient total joint. Patient is going to make sure she can line up help for her while she is at home for 2 weeks following surgery.  This note was generated in part with voice recognition software and I apologize for any typographical errors that were not detected and corrected.  Feliberto Gottron MPA-C  Reviewed paper H+P. No changes noted.

## 2020-03-23 NOTE — Discharge Instructions (Addendum)
Total Hip Replacement, Anterior, Care After This sheet gives you information about how to care for yourself after your procedure. Your health care provider may also give you more specific instructions. If you have problems or questions, contact your health care provider. What can I expect after the procedure? After the procedure, it is common to have:  Pain.  Stiffness.  Discomfort. Follow these instructions at home: Medicines  Take over-the-counter and prescription medicines only as told by your health care provider.  If you were prescribed a blood thinner (anticoagulant) to help prevent blood clots, take it as told by your health care provider. Incision care   Follow instructions from your health care provider about how to take care of your incision. Make sure you: ? Wash your hands with soap and water before you change your bandage (dressing). If soap and water are not available, use hand sanitizer. ? Change your dressing as told by your health care provider. ? Leave stitches (sutures), skin glue, or adhesive strips in place. These skin closures may need to stay in place for 2 weeks or longer. If adhesive strip edges start to loosen and curl up, you may trim the loose edges. Do not remove adhesive strips completely unless your health care provider tells you to do that.  Check your incision area every day for signs of infection. Check for: ? Redness, swelling, or pain. ? Fluid or blood. ? Warmth. ? Pus or a bad smell. Bathing  Do not take baths, swim, or use a hot tub until your health care provider approves. Ask your health care provider if you may take showers. You may only be allowed to take sponge baths.  Keep the dressing dry until your health care provider says it can be removed. Managing pain, stiffness, and swelling   If directed, put ice on the affected area. ? Put ice in a plastic bag. ? Place a towel between your skin and the bag. ? Leave the ice on for 20  minutes, 2-3 times a day.  Move your toes often to avoid stiffness and to lessen swelling.  Raise (elevate) your leg above the level of your heart while you are sitting or lying down. Activity  Rest as told by your health care provider.  Avoid sitting for a long time without moving. Get up to take short walks every 1-2 hours. This is important to improve blood flow and breathing. Ask for help if you feel weak or unsteady.  Do exercises as told by your health care provider or physical therapist.  Follow instructions from your health care provider about using a walker, crutches, or a cane. ? You may use your legs to support (bear) your body weight as told by your health care provider. Follow instructions about how much weight you may safely support on your affected leg (weight-bearing restrictions). ? A physical therapist may show you how to get out of a bed and chair and how to go up and down stairs. You will first do this with a walker, crutches, or a cane and then without any of these devices. ? Once you are able to walk without a limp, you may stop using a walker, crutches, or cane.  Return to your normal activities as told by your health care provider. Ask your health care provider what activities are safe for you. Safety  To help prevent falls, keep floors clear of objects you may trip over, and place items that you may need within easy reach.  Wear  an apron or tool belt with pockets for carrying objects. This leaves your hands free to help with your balance. Driving  Do not drive or use heavy machinery while taking prescription pain medicine.  Ask your health care provider when it is safe to drive. General instructions  Wear compression stockings as told by your health care provider. These stockings help to prevent blood clots and reduce swelling in your legs.  Continue with breathing exercises as directed by your health care provider. This helps prevent lung infection.  If  you are taking prescription pain medicine, take actions to prevent or treat constipation. Your health care provider may recommend that you: ? Drink enough fluid to keep your urine pale yellow. ? Eat foods that are high in fiber, such as fresh fruits and vegetables, whole grains, and beans. ? Limit foods that are high in fat and processed sugars, such as fried or sweet foods. ? Take an over-the-counter or prescription medicine for constipation.  Do not use any products that contain nicotine or tobacco, such as cigarettes and e-cigarettes. These can delay bone healing. If you need help quitting, ask your health care provider.  Tell your health care provider if you plan to have dental work. Also: ? Tell your dentist about your joint replacement. ? Ask your health care provider if there are any special instructions you need to follow before having dental care and routine cleanings.  Keep all follow-up visits as told by your health care provider. This is important. Contact a health care provider if:  You have a fever or chills.  You have a cough or feel short of breath.  Your medicine is not controlling your pain.  You have redness, swelling, or pain around your incision.  You have fluid or blood coming from your incision.  Your incision feels warm to the touch.  You have pus or a bad smell coming from your incision. Get help right away if you have:  Severe pain.  Trouble breathing.  Chest pain.  Redness, swelling, pain, and warmth in your calf or leg. Summary  Follow instructions from your health care provider about how to take care of your incision.  Do not take baths, swim, or use a hot tub until your health care provider approves.  Use crutches, a walker, or a cane as told by your health care provider.  If you were prescribed a blood thinner (anticoagulant) to help prevent blood clots, take it as told by your health care provider. This information is not intended to  replace advice given to you by your health care provider. Make sure you discuss any questions you have with your health care provider. Document Revised: 11/02/2018 Document Reviewed: 10/08/2017 Elsevier Patient Education  2020 [employer]   1) The drugs that you were given will stay in your system until tomorrow so for the next 24 hours you should not:  A) Drive an automobile B) Make any legal decisions C) Drink any alcoholic beverage   2) You may resume regular meals tomorrow.  Today it is better to start with liquids and gradually work up to solid foods.  You may eat anything you prefer, but it is better to start with liquids, then soup and crackers, and gradually work up to solid foods.   3) Please notify your doctor immediately if you have any unusual bleeding, trouble breathing, redness and pain at the surgery site, drainage, fever, or pain not relieved by medication.  4) Additional Instructions:        Please contact your physician with any problems or Same Day Surgery at [phone], Monday through Friday 6 am to 4 pm, or [place] at [facility] number at [phone].

## 2020-03-23 NOTE — Anesthesia Postprocedure Evaluation (Signed)
Anesthesia Post Note  Patient: Brenda Thornton  Procedure(s) Performed: TOTAL HIP ARTHROPLASTY ANTERIOR APPROACH (Right Hip)  Patient location during evaluation: PACU Anesthesia Type: Spinal Level of consciousness: oriented and awake and alert Pain management: pain level controlled Vital Signs Assessment: post-procedure vital signs reviewed and stable Respiratory status: spontaneous breathing, respiratory function stable and patient connected to nasal cannula oxygen Cardiovascular status: blood pressure returned to baseline and stable Postop Assessment: no headache, no backache and no apparent nausea or vomiting Anesthetic complications: no   No complications documented.   Last Vitals:  Vitals:   03/23/20 0822 03/23/20 1105  BP: (!) 135/93 118/66  Pulse: 100 (!) 101  Resp: 16 17  Temp: (!) 36.2 C (!) 35.7 C  SpO2: 96% 99%    Last Pain:  Vitals:   03/23/20 1151  TempSrc:   PainSc: 9                  Arita Miss

## 2020-03-23 NOTE — Op Note (Signed)
03/23/2020  11:04 AM  PATIENT:  Brenda Thornton  64 y.o. female  PRE-OPERATIVE DIAGNOSIS:  Primary osteoarthritis of right hip M16.11  POST-OPERATIVE DIAGNOSIS:  Primary osteoarthritis of right hip M16.11  PROCEDURE:  Procedure(s): TOTAL HIP ARTHROPLASTY ANTERIOR APPROACH (Right)  SURGEON: Laurene Footman, MD  ASSISTANTS: None  ANESTHESIA:   spinal  EBL:  Total I/O In: 1100 [I.V.:1000; IV Piggyback:100] Out: 400 [Urine:100; Blood:300]  BLOOD ADMINISTERED:none  DRAINS: Incisional wound VAC   LOCAL MEDICATIONS USED:  MARCAINE    and OTHER Exparel  SPECIMEN:  Source of Specimen:  Right femoral head  DISPOSITION OF SPECIMEN:  PATHOLOGY  COUNTS:  YES  TOURNIQUET:  * No tourniquets in log *  IMPLANTS: Medacta AMIS 7 standard stem with M ceramic head and 52 mm mPACT TM cup and liner  DICTATION: .Dragon Dictation   The patient was brought to the operating room and after spinal anesthesia was obtained patient was placed on the operative table with the ipsilateral foot into the Medacta attachment, contralateral leg on a well-padded table. C-arm was brought in and preop template x-ray taken. After prepping and draping in usual sterile fashion appropriate patient identification and timeout procedures were completed. Anterior approach to the hip was obtained and centered over the greater trochanter and TFL muscle. The subcutaneous tissue was incised hemostasis being achieved by electrocautery. TFL fascia was incised and the muscle retracted laterally deep retractor placed. The lateral femoral circumflex vessels were identified and ligated. The anterior capsule was exposed and a capsulotomy performed. The neck was identified and a femoral neck cut carried out with a saw. The head was removed without difficulty and showed sclerotic femoral head and acetabulum. Reaming was carried out to 52 mm and a 52 mm cup trial gave appropriate tightness to the acetabular component a 52 DM cup was impacted  into position.  Spurs around the acetabulum were also removed the leg was then externally rotated and ischiofemoral and pubofemoral releases carried out. The femur was sequentially broached to a size 7, size 7 standard with S head trials were placed and the final components chosen. The 7 standard stem was inserted along with a M ceramic 28 mm head and 52 mm liner. The hip was reduced and was stable the wound was thoroughly irrigated with fibrillar placed along the posterior capsule and medial neck. The deep fascia ws closed using a heavy Quill after infiltration of 30 cc of quarter percent Sensorcaine with epinephrine mixed with Exparel throughout the case .3-0 V-loc to close the skin with skin staples.  Incisional wound VAC applied and patient was sent to recovery in stable condition.   PLAN OF CARE: Discharge to home after PACU

## 2020-03-27 LAB — SURGICAL PATHOLOGY

## 2020-05-15 ENCOUNTER — Ambulatory Visit: Payer: Self-pay

## 2020-05-15 NOTE — Telephone Encounter (Signed)
Patient called stating that she has been to her PCP for ear pain rt ear.  They told her it was fluid and sent her home.  She went to [facility] on  Thursday 11/4 and they prescribed steroids and amoxicillin.  She states that last night her pain to rt ear was severe and kept her up all night.  Per protocol she will contact her PCP again for a follow up.  she verbalized understanding.   Reason for Disposition . [1] SEVERE pain AND [2] not improved 2 hours after pain medicine  Answer Assessment - Initial Assessment Questions 1. ANTIBIOTIC: "What antibiotic are you receiving?" "How many times per day?" "Ear drops or pills"     amoxicillin 2. ONSET: "When was the antibiotic started?"     Thurday the 4 th 3. PAIN: "How bad is the pain?"  (Scale 1-10; mild, moderate or severe)   - MILD (1-3): doesn't interfere with normal activities    - MODERATE (4-7): interferes with normal activities or awakens from sleep    - SEVERE (8-10): excruciating pain, unable to do any normal activities      2 4. DISCHARGE: "Is there any discharge? What color is it?"      no 5. FEVER: "Do you have a fever?" If Yes, ask: "What is your temperature, how was it measured, and when did it start?"     no 6. OTHER SYMPTOMS: "Do you have any other symptoms?" (e.g., redness of ear, headache, stiff neck, sore throat)    Rt ear not red 7. PREGNANCY: "Is there any chance you are pregnant?" "When was your last menstrual period?"     N/A  Protocols used: EAR - OTITIS EXTERNA FOLLOW-UP CALL-A-AH

## 2020-06-12 DIAGNOSIS — H9201 Otalgia, right ear: Secondary | ICD-10-CM

## 2020-06-12 HISTORY — DX: Otalgia, right ear: H92.01

## 2020-10-03 ENCOUNTER — Ambulatory Visit
Admission: RE | Admit: 2020-10-03 | Discharge: 2020-10-03 | Disposition: A | Discharge status: Home / Self Care | Payer: Medicare HMO | Source: Ambulatory Visit | Attending: Family Medicine | Admitting: Family Medicine

## 2020-10-03 ENCOUNTER — Other Ambulatory Visit: Payer: Self-pay | Admitting: Family Medicine

## 2020-10-03 DIAGNOSIS — J209 Acute bronchitis, unspecified: Secondary | ICD-10-CM

## 2021-01-31 ENCOUNTER — Other Ambulatory Visit: Payer: Self-pay | Admitting: Family Medicine

## 2021-01-31 DIAGNOSIS — Z1231 Encounter for screening mammogram for malignant neoplasm of breast: Secondary | ICD-10-CM

## 2021-03-14 ENCOUNTER — Other Ambulatory Visit: Payer: Self-pay

## 2021-03-14 ENCOUNTER — Ambulatory Visit
Admission: RE | Admit: 2021-03-14 | Discharge: 2021-03-14 | Disposition: A | Discharge status: Home / Self Care | Payer: Medicare HMO | Source: Ambulatory Visit | Attending: Family Medicine | Admitting: Family Medicine

## 2021-03-14 DIAGNOSIS — Z1231 Encounter for screening mammogram for malignant neoplasm of breast: Secondary | ICD-10-CM | POA: Diagnosis not present

## 2021-08-29 DIAGNOSIS — R059 Cough, unspecified: Secondary | ICD-10-CM | POA: Diagnosis not present

## 2021-08-29 DIAGNOSIS — Z20822 Contact with and (suspected) exposure to covid-19: Secondary | ICD-10-CM | POA: Diagnosis not present

## 2021-08-29 DIAGNOSIS — J0110 Acute frontal sinusitis, unspecified: Secondary | ICD-10-CM | POA: Diagnosis not present

## 2021-10-04 DIAGNOSIS — Z0100 Encounter for examination of eyes and vision without abnormal findings: Secondary | ICD-10-CM | POA: Diagnosis not present

## 2021-11-15 ENCOUNTER — Ambulatory Visit (INDEPENDENT_AMBULATORY_CARE_PROVIDER_SITE_OTHER): Payer: No Typology Code available for payment source | Admitting: Dermatology

## 2021-11-15 ENCOUNTER — Other Ambulatory Visit: Payer: Self-pay | Admitting: Dermatology

## 2021-11-15 DIAGNOSIS — L723 Sebaceous cyst: Secondary | ICD-10-CM

## 2021-11-15 DIAGNOSIS — L720 Epidermal cyst: Secondary | ICD-10-CM | POA: Diagnosis not present

## 2021-11-15 MED ORDER — MUPIROCIN 2 % EX OINT: 1.0000 "application " | TOPICAL_OINTMENT | Freq: Every day | CUTANEOUS | 0 refills | Status: DC

## 2021-11-15 MED ORDER — DOXYCYCLINE MONOHYDRATE 100 MG PO CAPS: 100.0000 mg | ORAL_CAPSULE | Freq: Two times a day (BID) | ORAL | 0 refills | Status: DC

## 2021-11-15 NOTE — Patient Instructions (Addendum)
Start Doxycycline '100mg'$  1 pill 2 times a day for 2 weeks, then decrease to 1 pill once a day until finished.  Take with food and drink ?Start Mupirocin ointment daily to wound until healed ? ?Doxycycline should be taken with food to prevent nausea. Do not lay down for 30 minutes after taking. Be cautious with sun exposure and use good sun protection while on this medication. Pregnant women should not take this medication.   ? ? ? ?Pre-Operative Instructions ? ?You are scheduled for a surgical procedure at [facility]. We recommend you read the following instructions. If you have any questions or concerns, please call the office at [phone]. ? ?Shower and wash the entire body with soap and water the day of your surgery paying special attention to cleansing at and around the planned surgery site. ? ?Avoid aspirin or aspirin containing products at least fourteen (14) days prior to your surgical procedure and for at least one week (7 Days) after your surgical procedure. If you take aspirin on a regular basis for heart disease or history of stroke or for any other reason, we may recommend you continue taking aspirin but please notify us if you take this on a regular basis. Aspirin can cause more bleeding to occur during surgery as well as prolonged bleeding and bruising after surgery.  ? ?Avoid other nonsteroidal pain medications at least one week prior to surgery and at least one week prior to your surgery. These include medications such as Ibuprofen (Motrin, Advil and Nuprin), Naprosyn, Voltaren, Relafen, etc. If medications are used for therapeutic reasons, please inform us as they can cause increased bleeding or prolonged bleeding during and bruising after surgical procedures.  ? ?Please advise [place] if you are taking any "blood thinner" medications such as Coumadin or Dipyridamole or Plavix or similar medications. These cause increased bleeding and prolonged bleeding during procedures and bruising after  surgical procedures. We may have to consider discontinuing these medications briefly prior to and shortly after your surgery if safe to do so.  ? ?Please inform us of all medications you are currently taking. All medications that are taken regularly should be taken the day of surgery as you always do. Nevertheless, we need to be informed of what medications you are taking prior to surgery to know whether they will affect the procedure or cause any complications.  ? ?Please inform us of any medication allergies. Also inform us of whether you have allergies to Latex or rubber products or whether you have had any adverse reaction to Lidocaine or Epinephrine. ? ?Please inform us of any prosthetic or artificial body parts such as artificial heart valve, joint replacements, etc., or similar condition that might require preoperative antibiotics.  ? ?We recommend avoidance of alcohol at least two weeks prior to surgery and continued avoidance for at least two weeks after surgery.  ? ?We recommend discontinuation of tobacco smoking at least two weeks prior to surgery and continued abstinence for at least two weeks after surgery. ? ?Do not plan strenuous exercise, strenuous work or strenuous lifting for approximately four weeks after your surgery.  ? ?We request if you are unable to make your scheduled surgical appointment, please call us at least a week in advance or as soon as you are aware of a problem so that we can cancel or reschedule the appointment.  ? ?You MAY TAKE TYLENOL (acetaminophen) for pain as it is not a blood thinner.  ? ?PLEASE PLAN TO BE IN TOWN  FOR TWO WEEKS FOLLOWING SURGERY, THIS IS IMPORTANT SO YOU CAN BE CHECKED FOR DRESSING CHANGES, SUTURE REMOVAL AND TO MONITOR FOR POSSIBLE COMPLICATIONS.  ? ?If You Need Anything After Your Visit ? ?If you have any questions or concerns for your doctor, please call our main line at [phone] and press option 4 to reach your doctor's medical assistant. If no one  answers, please leave a voicemail as directed and we will return your call as soon as possible. Messages left after 4 pm will be answered the following business day.  ? ?You may also send [place] a message via MyChart. We typically respond to MyChart messages within 1-2 business days. ? ?For prescription refills, please ask your pharmacy to contact our office. Our fax number is [phone]. ? ?If you have an urgent issue when the clinic is closed that cannot wait until the next business day, you can page your doctor at the number below.   ? ?Please note that while we do our best to be available for urgent issues outside of office hours, we are not available 24/7.  ? ?If you have an urgent issue and are unable to reach [place], you may choose to seek medical care at your doctor's office, retail clinic, urgent care center, or emergency room. ? ?If you have a medical emergency, please immediately call 911 or go to the emergency department. ? ?Pager Numbers ? ?- Dr. Nehemiah Massed: [phone] ? ?- Dr. Laurence Ferrari: [phone] ? ?- Dr. Nicole Kindred: [phone] ? ?In the event of inclement weather, please call our main line at [phone] for an update on the status of any delays or closures. ? ?Dermatology Medication Tips: ?Please keep the boxes that topical medications come in in order to help keep track of the instructions about where and how to use these. Pharmacies typically print the medication instructions only on the boxes and not directly on the medication tubes.  ? ?If your medication is too expensive, please contact our office at [phone] option 4 or send [place] a message through [place].  ? ?We are unable to tell what your co-pay for medications will be in advance as this is different depending on your insurance coverage. However, we may be able to find a substitute medication at lower cost or fill out paperwork to get insurance to cover a needed medication.  ? ?If a prior authorization is required to get your medication covered  by your insurance company, please allow [place] 1-2 business days to complete this process. ? ?Drug prices often vary depending on where the prescription is filled and some pharmacies may offer cheaper prices. ? ?The website www.goodrx.com contains coupons for medications through different pharmacies. The prices here do not account for what the cost may be with help from insurance (it may be cheaper with your insurance), but the website can give you the price if you did not use any insurance.  ?- You can print the associated coupon and take it with your prescription to the pharmacy.  ?- You may also stop by our office during regular business hours and pick up a GoodRx coupon card.  ?- If you need your prescription sent electronically to a different pharmacy, notify our office through [facility] or by phone at [phone] option 4. ? ? ? ? ?Si Usted Necesita Algo Despu?s de Su Visita ? ?Tambi?n puede enviarnos un mensaje a trav?s de MyChart. Por lo general respondemos a los mensajes de MyChart en el transcurso de 1 a 2 d?as h?biles. ? ?  Para renovar recetas, por favor pida a su farmacia que se ponga en contacto con nuestra oficina. Nuestro n?mero de fax es el [phone]. ? ?Si tiene un asunto urgente cuando la cl?nica est? cerrada y que no puede esperar hasta el siguiente d?a h?bil, puede llamar/localizar a su doctor(a) al n?mero que aparece a continuaci?n.  ? ?Por favor, tenga en cuenta que aunque hacemos todo lo posible para estar disponibles para asuntos urgentes fuera del horario de oficina, no estamos disponibles las 24 horas del d?a, los 7 d?as de la semana.  ? ?Si tiene un problema urgente y no puede comunicarse con nosotros, puede optar por buscar atenci?n m?dica  en el consultorio de su doctor(a), en una cl?nica privada, en un centro de atenci?n urgente o en una sala de emergencias. ? ?Si tiene Engineer, maintenance (IT) m?dica, por favor llame inmediatamente al 911 o vaya a la sala de emergencias. ? ?N?meros de  b?per ? ?- Dr. Nehemiah Massed: [phone] ? ?- Dra. Moye: [phone] ? ?- Dra. Nicole Kindred: [phone] ? ?En caso de inclemencias del tiempo, por favor llame a nuestra l?nea principal al [phone] para Ardelia Mems

## 2021-11-15 NOTE — Progress Notes (Signed)
? ?  Follow-Up Visit ?  ?Subjective  ?Brenda Thornton is a 66 y.o. female who presents for the following: check spot (L breast, 2 yrs, got inflamed ~2wks ago, painful, no drainage). ? ? ?The following portions of the chart were reviewed this encounter and updated as appropriate:  ?  ?  ? ?Review of Systems:  No other skin or systemic complaints except as noted in HPI or Assessment and Plan. ? ?Objective  ?Well appearing patient in no apparent distress; mood and affect are within normal limits. ? ?A focused examination was performed including chest. Relevant physical exam findings are noted in the Assessment and Plan. ? ?L medial breast ?Erythematous tender nodule L medial breast 2.5cm ? ? ? ?Assessment & Plan  ?Inflamed epidermoid cyst of skin ?L medial breast ? ?Symptomatic with pain ? ?I&D today ? ?Start Doxycycline '100mg'$  1 po bid for 2 weeks, then decrease to 1 po qd until finished.  Take with food and drink. ?Start Mupirocin ointment qd to aa and cover until healed ? ?Discussed excising once healed from I&D ~2 months ? ?Doxycycline should be taken with food to prevent nausea. Do not lay down for 30 minutes after taking. Be cautious with sun exposure and use good sun protection while on this medication. Pregnant women should not take this medication.   ? ?doxycycline (MONODOX) 100 MG capsule - L medial breast ?Take 1 capsule (100 mg total) by mouth 2 (two) times daily. Take with food and drink ? ?Incision and Drainage - L medial breast ?Location: L medial brest ? ?Informed Consent: Discussed risks (permanent scarring, light or dark discoloration, infection, pain, bleeding, bruising, redness, damage to adjacent structures, and recurrence of the lesion) and benefits of the procedure, as well as the alternatives.  Informed consent was obtained. ? ?Preparation: The area was prepped with alcohol. ? ?Anesthesia: Lidocaine 1% with epinephrine ? ?Procedure Details: An incision x 2 was made overlying the lesion. The  lesion drained white, chalky cyst material, blood, pus, and cyst wall.  ?A small amount of fluid was drained.   The lesion was multiloculated.   Multiple cavities were opened and drained.   Pieces of cyst wall were extracted.    ?Antibiotic ointment and a sterile pressure dressing were applied. The patient tolerated procedure well. ? ?Total number of lesions drained: 1 multiloculated ? ?Plan: The patient was instructed on post-op care. Recommend OTC analgesia as needed for pain. ? ? ?mupirocin ointment (BACTROBAN) 2 % - L medial breast ?Apply 1 application. topically daily. Qd to wound on chest until healed ? ? ?Return in about 2 months (around 01/15/2022) for surgery for Cyst L medial breast. ? ?I, [name], RMA, am acting as scribe for [name], MD . ? ?Documentation: I have reviewed the above documentation for accuracy and completeness, and I agree with the above. ? ?[name] MD  ? ?

## 2022-01-28 ENCOUNTER — Ambulatory Visit (INDEPENDENT_AMBULATORY_CARE_PROVIDER_SITE_OTHER): Payer: No Typology Code available for payment source | Admitting: Dermatology

## 2022-01-28 DIAGNOSIS — D485 Neoplasm of uncertain behavior of skin: Secondary | ICD-10-CM

## 2022-01-28 DIAGNOSIS — L910 Hypertrophic scar: Secondary | ICD-10-CM | POA: Diagnosis not present

## 2022-01-28 DIAGNOSIS — L720 Epidermal cyst: Secondary | ICD-10-CM

## 2022-01-28 NOTE — Patient Instructions (Addendum)
Wound Care Instructions for After Surgery  On the day following your surgery, you should begin doing daily dressing changes until your sutures are removed: Remove the bandage. Cleanse the wound gently with soap and water.  Make sure you then dry the skin surrounding the wound completely or the tape will not stick to the skin. Do not use cotton balls on the wound. After the wound is clean and dry, apply the ointment (either prescription antibiotic prescribed by your doctor Mupirocin or plain Vaseline if nothing was prescribed) gently with a Q-tip. If you are using a bandaid to cover: Apply a bandaid large enough to cover the entire wound. If you do not have a bandaid large enough to cover the wound OR if you are sensitive to bandaid adhesive: Cut a non-stick pad (such as Telfa) to fit the size of the wound.  Cover the wound with the non-stick pad. If the wound is draining, you may want to add a small amount of gauze on top of the non-stick pad for a little added compression to the area. Use tape to seal the area completely.  For the next 1-2 weeks: Be sure to keep the wound moist with ointment 24/7 to ensure best healing. If you are unable to cover the wound with a bandage to hold the ointment in place, you may need to reapply the ointment several times a day. Do not bend over or lift heavy items to reduce the chance of elevated blood pressure to the wound. Do not participate in particularly strenuous activities.  Below is a list of dressing supplies you might need.  Cotton-tipped applicators - Q-tips Gauze pads (2x2 and/or 4x4) - All-Purpose Sponges New and clean tube of petroleum jelly (Vaseline) OR prescription antibiotic ointment if prescribed Either a bandaid large enough to cover the entire wound OR non-stick dressing material (Telfa) and Tape (Paper or Hypafix)  FOR ADULT SURGERY PATIENTS: If you need something for pain relief, you may take 1 extra strength Tylenol (acetaminophen) and  2 ibuprofen (200 mg) together every 4 hours as needed. (Do not take these medications if you are allergic to them or if you know you cannot take them for any other reason). Typically you may only need pain medication for 1-3 days.   Comments on the Post-Operative Period Slight swelling and redness often appear around the wound. This is normal and will disappear within several days following the surgery. The healing wound will drain a brownish-red-yellow discharge during healing. This is a normal phase of wound healing. As the wound begins to heal, the drainage may increase in amount. Again, this drainage is normal. Notify us if the drainage becomes persistently bloody, excessively swollen, or intensely painful or develops a foul odor or red streaks.  The healing wound will also typically be itchy. This is normal. If you have severe or persistent pain, Notify us if the discomfort is severe or persistent. Avoid alcoholic beverages when taking pain medicine.  In Case of Wound Hemorrhage A wound hemorrhage is when the bandage suddenly becomes soaked with bright red blood and flows profusely. If this happens, sit down or lie down with your head elevated. If the wound has a dressing on it, do not remove the dressing. Apply pressure to the existing gauze. If the wound is not covered, use a gauze pad to apply pressure and continue applying the pressure for 20 minutes without peeking. DO NOT COVER THE WOUND WITH A LARGE TOWEL OR Brady CLOTH. Release your hand from  the wound site but do not remove the dressing. If the bleeding has stopped, gently clean around the wound. Leave the dressing in place for 24 hours if possible. This wait time allows the blood vessels to close off so that you do not spark a new round of bleeding by disrupting the newly clotted blood vessels with an immediate dressing change. If the bleeding does not subside, continue to hold pressure for 40 minutes. If bleeding continues, page your  physician, contact an After Hours clinic or go to the Emergency Room.   Due to recent changes in healthcare laws, you may see results of your pathology and/or laboratory studies on MyChart before the doctors have had a chance to review them. We understand that in some cases there may be results that are confusing or concerning to you. Please understand that not all results are received at the same time and often the doctors may need to interpret multiple results in order to provide you with the best plan of care or course of treatment. Therefore, we ask that you please give [place] 2 business days to thoroughly review all your results before contacting the office for clarification. Should we see a critical lab result, you will be contacted sooner.   If You Need Anything After Your Visit  If you have any questions or concerns for your doctor, please call our main line at [phone] and press option 4 to reach your doctor's medical assistant. If no one answers, please leave a voicemail as directed and we will return your call as soon as possible. Messages left after 4 pm will be answered the following business day.   You may also send [place] a message via [place]. We typically respond to MyChart messages within 1-2 business days.  For prescription refills, please ask your pharmacy to contact our office. Our fax number is [phone].  If you have an urgent issue when the clinic is closed that cannot wait until the next business day, you can page your doctor at the number below.    Please note that while we do our best to be available for urgent issues outside of office hours, we are not available 24/7.   If you have an urgent issue and are unable to reach [place], you may choose to seek medical care at your doctor's office, retail clinic, urgent care center, or emergency room.  If you have a medical emergency, please immediately call 911 or go to the emergency department.  Pager Numbers  - Dr. Nehemiah Massed:  [phone]  - Dr. Laurence Ferrari: [phone]  - Dr. Nicole Kindred: [phone]  In the event of inclement weather, please call our main line at [phone] for an update on the status of any delays or closures.  Dermatology Medication Tips: Please keep the boxes that topical medications come in in order to help keep track of the instructions about where and how to use these. Pharmacies typically print the medication instructions only on the boxes and not directly on the medication tubes.   If your medication is too expensive, please contact our office at [phone] option 4 or send [place] a message through [place].   We are unable to tell what your co-pay for medications will be in advance as this is different depending on your insurance coverage. However, we may be able to find a substitute medication at lower cost or fill out paperwork to get insurance to cover a needed medication.   If a prior authorization is required to get your medication  your insurance company, please allow us 1-2 business days to complete this process.  Drug prices often vary depending on where the prescription is filled and some pharmacies may offer cheaper prices.  The website www.goodrx.com contains coupons for medications through different pharmacies. The prices here do not account for what the cost may be with help from insurance (it may be cheaper with your insurance), but the website can give you the price if you did not use any insurance.  - You can print the associated coupon and take it with your prescription to the pharmacy.  - You may also stop by our office during regular business hours and pick up a GoodRx coupon card.  - If you need your prescription sent electronically to a different pharmacy, notify our office through [facility] MyChart or by phone at [phone] option 4.     Si Usted Necesita Algo Despus de Su Visita  Tambin puede enviarnos un mensaje a travs de MyChart. Por lo general  respondemos a los mensajes de MyChart en el transcurso de 1 a 2 das hbiles.  Para renovar recetas, por favor pida a su farmacia que se ponga en contacto con nuestra oficina. Nuestro nmero de fax es el [phone].  Si tiene un asunto urgente cuando la clnica est cerrada y que no puede esperar hasta el siguiente da hbil, puede llamar/localizar a su doctor(a) al nmero que aparece a continuacin.   Por favor, tenga en cuenta que aunque hacemos todo lo posible para estar disponibles para asuntos urgentes fuera del horario de oficina, no estamos disponibles las 24 horas del da, los 7 das de la semana.   Si tiene un problema urgente y no puede comunicarse con nosotros, puede optar por buscar atencin mdica  en el consultorio de su doctor(a), en una clnica privada, en un centro de atencin urgente o en una sala de emergencias.  Si tiene una emergencia mdica, por favor llame inmediatamente al 911 o vaya a la sala de emergencias.  Nmeros de bper  - Dr. Kowalski: [phone]  - Dra. Moye: [phone]  - Dra. Stewart: [phone]  En caso de inclemencias del tiempo, por favor llame a nuestra lnea principal al [phone] para una actualizacin sobre el estado de cualquier retraso o cierre.  Consejos para la medicacin en dermatologa: Por favor, guarde las cajas en las que vienen los medicamentos de uso tpico para ayudarle a seguir las instrucciones sobre dnde y cmo usarlos. Las farmacias generalmente imprimen las instrucciones del medicamento slo en las cajas y no directamente en los tubos del medicamento.   Si su medicamento es muy caro, por favor, pngase en contacto con nuestra oficina llamando al [phone] y presione la opcin 4 o envenos un mensaje a travs de MyChart.   No podemos decirle cul ser su copago por los medicamentos por adelantado ya que esto es diferente dependiendo de la cobertura de su seguro. Sin embargo, es posible que podamos encontrar un  medicamento sustituto a menor costo o llenar un formulario para que el seguro cubra el medicamento que se considera necesario.   Si se requiere una autorizacin previa para que su compaa de seguros cubra su medicamento, por favor permtanos de 1 a 2 das hbiles para completar este proceso.  Los precios de los medicamentos varan con frecuencia dependiendo del lugar de dnde se surte la receta y alguna farmacias pueden ofrecer precios ms baratos.  El sitio web www.goodrx.com tiene cupones para medicamentos de diferentes farmacias. Los precios aqu no tienen   en cuenta lo que podra costar con la ayuda del seguro (puede ser ms barato con su seguro), pero el sitio web puede darle el precio si no utiliz ningn seguro.  - Puede imprimir el cupn correspondiente y llevarlo con su receta a la farmacia.  - Tambin puede pasar por nuestra oficina durante el horario de atencin regular y recoger una tarjeta de cupones de GoodRx.  - Si necesita que su receta se enve electrnicamente a una farmacia diferente, informe a nuestra oficina a travs de MyChart de [place] o por telfono llamando al [phone] y presione la opcin 4.  

## 2022-01-28 NOTE — Progress Notes (Signed)
   Follow-Up Visit   Subjective  Brenda Thornton is a 66 y.o. female who presents for the following: Cyst (Left medial breast. Patient presents for excision. ).   The following portions of the chart were reviewed this encounter and updated as appropriate:       Review of Systems:  No other skin or systemic complaints except as noted in HPI or Assessment and Plan.  Objective  Well appearing patient in no apparent distress; mood and affect are within normal limits.  A focused examination was performed including face, breast. Relevant physical exam findings are noted in the Assessment and Plan.  Left Medial Breast Firm violaceous hyperpigmented subcutaneous nodule 2.1 x 1.5cm    Assessment & Plan  Neoplasm of uncertain behavior of skin Left Medial Breast  Skin excision  Lesion length (cm):  2.1 Lesion width (cm):  1.5 Margin per side (cm):  0.2 Total excision diameter (cm):  2.5 Informed consent: discussed and consent obtained   Timeout: patient name, date of birth, surgical site, and procedure verified   Procedure prep:  Patient was prepped and draped in usual sterile fashion Prep type:  Povidone-iodine Anesthesia: the lesion was anesthetized in a standard fashion   Anesthetic:  1% lidocaine w/ epinephrine 1-100,000 buffered w/ 8.4% NaHCO3 (Total 23cc - 10cc bupivicaine, 13cc lido w/epi) Instrument used: #15 blade   Hemostasis achieved with: pressure and electrodesiccation   Outcome: patient tolerated procedure well with no complications    Skin repair Complexity:  Intermediate Final length (cm):  4.3 Informed consent: discussed and consent obtained   Timeout: patient name, date of birth, surgical site, and procedure verified   Reason for type of repair: reduce tension to allow closure, reduce the risk of dehiscence, infection, and necrosis, reduce subcutaneous dead space and avoid a hematoma, preserve normal anatomical and functional relationships and enhance both  functionality and cosmetic results   Undermining: edges could be approximated without difficulty and edges undermined   Subcutaneous layers (deep stitches):  Suture size:  3-0 Suture type: Vicryl (polyglactin 910)   Stitches:  Buried vertical mattress Fine/surface layer approximation (top stitches):  Suture size:  4-0 Suture type: nylon   Stitches: simple interrupted   Suture removal (days):  7 Hemostasis achieved with: suture Outcome: patient tolerated procedure well with no complications   Post-procedure details: sterile dressing applied and wound care instructions given   Dressing type: pressure dressing (mupirocin)    Specimen 1 - Surgical pathology Differential Diagnosis: Cyst vs other Check Margins: No Firm, violaceous, tan subcutaneous nodule, 2.1 x 1.5 cm  Start Mupirocin ointment qd to excision site (pt has mupirocin that was sent in 11/2021)   Return in about 1 week (around 02/04/2022) for suture removal.  I, Jamesetta Orleans, CMA, am acting as scribe for [name], MD .  Documentation: I have reviewed the above documentation for accuracy and completeness, and I agree with the above.  [name] MD

## 2022-01-29 ENCOUNTER — Telehealth: Payer: Self-pay

## 2022-01-29 NOTE — Telephone Encounter (Signed)
Left message for patient to call with any problems or questions from surgery yesterday.  

## 2022-02-04 ENCOUNTER — Ambulatory Visit (INDEPENDENT_AMBULATORY_CARE_PROVIDER_SITE_OTHER): Payer: No Typology Code available for payment source | Admitting: Dermatology

## 2022-02-04 DIAGNOSIS — Z4802 Encounter for removal of sutures: Secondary | ICD-10-CM

## 2022-02-04 DIAGNOSIS — L720 Epidermal cyst: Secondary | ICD-10-CM

## 2022-02-04 NOTE — Progress Notes (Signed)
   Follow-Up Visit   Subjective  Brenda Thornton is a 66 y.o. female who presents for the following: Post op (Keloid and epidermoid cyst, biopsy proven, of the left medial breast.).   The following portions of the chart were reviewed this encounter and updated as appropriate:       Review of Systems:  No other skin or systemic complaints except as noted in HPI or Assessment and Plan.  Objective  Well appearing patient in no apparent distress; mood and affect are within normal limits.  A focused examination was performed including trunk. Relevant physical exam findings are noted in the Assessment and Plan.  Left Breast Incision site is clean, dry and intact     Assessment & Plan  Epidermoid cyst Left Breast  Proven KELOID AND EPIDERMOID CYST, MARGINS FREE  Wound cleansed, sutures removed, wound cleansed and steri strips applied. Discussed pathology results.  Information on Serica Scar Gel given to patient to start once area healed. Observe for thickening of scar and possible keloid formation   Return if symptoms worsen or fail to improve.  IJamesetta Orleans, CMA, am acting as scribe for [name], MD .  Documentation: I have reviewed the above documentation for accuracy and completeness, and I agree with the above.  [name] MD

## 2022-02-04 NOTE — Patient Instructions (Addendum)
Due to recent changes in healthcare laws, you may see results of your pathology and/or laboratory studies on MyChart before the doctors have had a chance to review them. We understand that in some cases there may be results that are confusing or concerning to you. Please understand that not all results are received at the same time and often the doctors may need to interpret multiple results in order to provide you with the best plan of care or course of treatment. Therefore, we ask that you please give us 2 business days to thoroughly review all your results before contacting the office for clarification. Should we see a critical lab result, you will be contacted sooner.   If You Need Anything After Your Visit  If you have any questions or concerns for your doctor, please call our main line at [phone] and press option 4 to reach your doctor's medical assistant. If no one answers, please leave a voicemail as directed and we will return your call as soon as possible. Messages left after 4 pm will be answered the following business day.   You may also send us a message via MyChart. We typically respond to MyChart messages within 1-2 business days.  For prescription refills, please ask your pharmacy to contact our office. Our fax number is [phone].  If you have an urgent issue when the clinic is closed that cannot wait until the next business day, you can page your doctor at the number below.    Please note that while we do our best to be available for urgent issues outside of office hours, we are not available 24/7.   If you have an urgent issue and are unable to reach us, you may choose to seek medical care at your doctor's office, retail clinic, urgent care center, or emergency room.  If you have a medical emergency, please immediately call 911 or go to the emergency department.  Pager Numbers  - Dr. Kowalski: [phone]  - Dr. Moye: [phone]  - Dr. Stewart:  [phone]  In the event of inclement weather, please call our main line at [phone] for an update on the status of any delays or closures.  Dermatology Medication Tips: Please keep the boxes that topical medications come in in order to help keep track of the instructions about where and how to use these. Pharmacies typically print the medication instructions only on the boxes and not directly on the medication tubes.   If your medication is too expensive, please contact our office at [phone] option 4 or send us a message through MyChart.   We are unable to tell what your co-pay for medications will be in advance as this is different depending on your insurance coverage. However, we may be able to find a substitute medication at lower cost or fill out paperwork to get insurance to cover a needed medication.   If a prior authorization is required to get your medication covered by your insurance company, please allow us 1-2 business days to complete this process.  Drug prices often vary depending on where the prescription is filled and some pharmacies may offer cheaper prices.  The website www.goodrx.com contains coupons for medications through different pharmacies. The prices here do not account for what the cost may be with help from insurance (it may be cheaper with your insurance), but the website can give you the price if you did not use any insurance.  - You can print the associated coupon and take it with   your prescription to the pharmacy.  - You may also stop by our office during regular business hours and pick up a GoodRx coupon card.  - If you need your prescription sent electronically to a different pharmacy, notify our office through [facility] MyChart or by phone at [phone] option 4.     Si Usted Necesita Algo Despus de Su Visita  Tambin puede enviarnos un mensaje a travs de MyChart. Por lo general respondemos a los mensajes de MyChart en el transcurso de 1 a 2  das hbiles.  Para renovar recetas, por favor pida a su farmacia que se ponga en contacto con nuestra oficina. Nuestro nmero de fax es el [phone].  Si tiene un asunto urgente cuando la clnica est cerrada y que no puede esperar hasta el siguiente da hbil, puede llamar/localizar a su doctor(a) al nmero que aparece a continuacin.   Por favor, tenga en cuenta que aunque hacemos todo lo posible para estar disponibles para asuntos urgentes fuera del horario de oficina, no estamos disponibles las 24 horas del da, los 7 das de la semana.   Si tiene un problema urgente y no puede comunicarse con nosotros, puede optar por buscar atencin mdica  en el consultorio de su doctor(a), en una clnica privada, en un centro de atencin urgente o en una sala de emergencias.  Si tiene una emergencia mdica, por favor llame inmediatamente al 911 o vaya a la sala de emergencias.  Nmeros de bper  - Dr. Kowalski: [phone]  - Dra. Moye: [phone]  - Dra. Stewart: [phone]  En caso de inclemencias del tiempo, por favor llame a nuestra lnea principal al [phone] para una actualizacin sobre el estado de cualquier retraso o cierre.  Consejos para la medicacin en dermatologa: Por favor, guarde las cajas en las que vienen los medicamentos de uso tpico para ayudarle a seguir las instrucciones sobre dnde y cmo usarlos. Las farmacias generalmente imprimen las instrucciones del medicamento slo en las cajas y no directamente en los tubos del medicamento.   Si su medicamento es muy caro, por favor, pngase en contacto con nuestra oficina llamando al [phone] y presione la opcin 4 o envenos un mensaje a travs de MyChart.   No podemos decirle cul ser su copago por los medicamentos por adelantado ya que esto es diferente dependiendo de la cobertura de su seguro. Sin embargo, es posible que podamos encontrar un medicamento sustituto a menor costo o llenar un formulario para que el  seguro cubra el medicamento que se considera necesario.   Si se requiere una autorizacin previa para que su compaa de seguros cubra su medicamento, por favor permtanos de 1 a 2 das hbiles para completar este proceso.  Los precios de los medicamentos varan con frecuencia dependiendo del lugar de dnde se surte la receta y alguna farmacias pueden ofrecer precios ms baratos.  El sitio web www.goodrx.com tiene cupones para medicamentos de diferentes farmacias. Los precios aqu no tienen en cuenta lo que podra costar con la ayuda del seguro (puede ser ms barato con su seguro), pero el sitio web puede darle el precio si no utiliz ningn seguro.  - Puede imprimir el cupn correspondiente y llevarlo con su receta a la farmacia.  - Tambin puede pasar por nuestra oficina durante el horario de atencin regular y recoger una tarjeta de cupones de GoodRx.  - Si necesita que su receta se enve electrnicamente a una farmacia diferente, informe a nuestra oficina a travs de MyChart de [facility]   o por telfono llamando al [phone] y presione la opcin 4.  

## 2022-02-06 DIAGNOSIS — Z23 Encounter for immunization: Secondary | ICD-10-CM | POA: Diagnosis not present

## 2022-02-06 DIAGNOSIS — I10 Essential (primary) hypertension: Secondary | ICD-10-CM | POA: Diagnosis not present

## 2022-02-06 DIAGNOSIS — Z1211 Encounter for screening for malignant neoplasm of colon: Secondary | ICD-10-CM | POA: Diagnosis not present

## 2022-02-06 DIAGNOSIS — Z0000 Encounter for general adult medical examination without abnormal findings: Secondary | ICD-10-CM | POA: Diagnosis not present

## 2022-02-12 ENCOUNTER — Ambulatory Visit: Payer: No Typology Code available for payment source | Admitting: Dermatology

## 2022-02-12 DIAGNOSIS — L231 Allergic contact dermatitis due to adhesives: Secondary | ICD-10-CM | POA: Diagnosis not present

## 2022-02-12 MED ORDER — MOMETASONE FUROATE 0.1 % EX CREA
1.0000 | TOPICAL_CREAM | Freq: Every day | CUTANEOUS | 0 refills | Status: DC | PRN
Start: 2022-02-12 — End: 2023-08-14

## 2022-02-12 NOTE — Patient Instructions (Addendum)
Topical steroids (such as triamcinolone, fluocinolone, fluocinonide, mometasone, clobetasol, halobetasol, betamethasone, hydrocortisone) can cause thinning and lightening of the skin if they are used for too long in the same area. Your physician has selected the right strength medicine for your problem and area affected on the body. Please use your medication only as directed by your physician to prevent side effects.    Due to recent changes in healthcare laws, you may see results of your pathology and/or laboratory studies on MyChart before the doctors have had a chance to review them. We understand that in some cases there may be results that are confusing or concerning to you. Please understand that not all results are received at the same time and often the doctors may need to interpret multiple results in order to provide you with the best plan of care or course of treatment. Therefore, we ask that you please give us 2 business days to thoroughly review all your results before contacting the office for clarification. Should we see a critical lab result, you will be contacted sooner.   If You Need Anything After Your Visit  If you have any questions or concerns for your doctor, please call our main line at [phone] and press option 4 to reach your doctor's medical assistant. If no one answers, please leave a voicemail as directed and we will return your call as soon as possible. Messages left after 4 pm will be answered the following business day.   You may also send us a message via MyChart. We typically respond to MyChart messages within 1-2 business days.  For prescription refills, please ask your pharmacy to contact our office. Our fax number is [phone].  If you have an urgent issue when the clinic is closed that cannot wait until the next business day, you can page your doctor at the number below.    Please note that while we do our best to be available for urgent issues outside of  office hours, we are not available 24/7.   If you have an urgent issue and are unable to reach us, you may choose to seek medical care at your doctor's office, retail clinic, urgent care center, or emergency room.  If you have a medical emergency, please immediately call 911 or go to the emergency department.  Pager Numbers  - Dr. Kowalski: [phone]  - Dr. Moye: [phone]  - Dr. Stewart: [phone]  In the event of inclement weather, please call our main line at [phone] for an update on the status of any delays or closures.  Dermatology Medication Tips: Please keep the boxes that topical medications come in in order to help keep track of the instructions about where and how to use these. Pharmacies typically print the medication instructions only on the boxes and not directly on the medication tubes.   If your medication is too expensive, please contact our office at [phone] option 4 or send us a message through MyChart.   We are unable to tell what your co-pay for medications will be in advance as this is different depending on your insurance coverage. However, we may be able to find a substitute medication at lower cost or fill out paperwork to get insurance to cover a needed medication.   If a prior authorization is required to get your medication covered by your insurance company, please allow us 1-2 business days to complete this process.  Drug prices often vary depending on where the prescription is filled and some pharmacies   may offer cheaper prices.  The website www.goodrx.com contains coupons for medications through different pharmacies. The prices here do not account for what the cost may be with help from insurance (it may be cheaper with your insurance), but the website can give you the price if you did not use any insurance.  - You can print the associated coupon and take it with your prescription to the pharmacy.  - You may also stop by our office during  regular business hours and pick up a GoodRx coupon card.  - If you need your prescription sent electronically to a different pharmacy, notify our office through [facility] MyChart or by phone at [phone] option 4.     Si Usted Necesita Algo Despus de Su Visita  Tambin puede enviarnos un mensaje a travs de MyChart. Por lo general respondemos a los mensajes de MyChart en el transcurso de 1 a 2 das hbiles.  Para renovar recetas, por favor pida a su farmacia que se ponga en contacto con nuestra oficina. Nuestro nmero de fax es el [phone].  Si tiene un asunto urgente cuando la clnica est cerrada y que no puede esperar hasta el siguiente da hbil, puede llamar/localizar a su doctor(a) al nmero que aparece a continuacin.   Por favor, tenga en cuenta que aunque hacemos todo lo posible para estar disponibles para asuntos urgentes fuera del horario de oficina, no estamos disponibles las 24 horas del da, los 7 das de la semana.   Si tiene un problema urgente y no puede comunicarse con nosotros, puede optar por buscar atencin mdica  en el consultorio de su doctor(a), en una clnica privada, en un centro de atencin urgente o en una sala de emergencias.  Si tiene una emergencia mdica, por favor llame inmediatamente al 911 o vaya a la sala de emergencias.  Nmeros de bper  - Dr. Kowalski: [phone]  - Dra. Moye: [phone]  - Dra. Stewart: [phone]  En caso de inclemencias del tiempo, por favor llame a nuestra lnea principal al [phone] para una actualizacin sobre el estado de cualquier retraso o cierre.  Consejos para la medicacin en dermatologa: Por favor, guarde las cajas en las que vienen los medicamentos de uso tpico para ayudarle a seguir las instrucciones sobre dnde y cmo usarlos. Las farmacias generalmente imprimen las instrucciones del medicamento slo en las cajas y no directamente en los tubos del medicamento.   Si su medicamento es muy  caro, por favor, pngase en contacto con nuestra oficina llamando al [phone] y presione la opcin 4 o envenos un mensaje a travs de MyChart.   No podemos decirle cul ser su copago por los medicamentos por adelantado ya que esto es diferente dependiendo de la cobertura de su seguro. Sin embargo, es posible que podamos encontrar un medicamento sustituto a menor costo o llenar un formulario para que el seguro cubra el medicamento que se considera necesario.   Si se requiere una autorizacin previa para que su compaa de seguros cubra su medicamento, por favor permtanos de 1 a 2 das hbiles para completar este proceso.  Los precios de los medicamentos varan con frecuencia dependiendo del lugar de dnde se surte la receta y alguna farmacias pueden ofrecer precios ms baratos.  El sitio web www.goodrx.com tiene cupones para medicamentos de diferentes farmacias. Los precios aqu no tienen en cuenta lo que podra costar con la ayuda del seguro (puede ser ms barato con su seguro), pero el sitio web puede darle el precio si no   utiliz ningn seguro.  - Puede imprimir el cupn correspondiente y llevarlo con su receta a la farmacia.  - Tambin puede pasar por nuestra oficina durante el horario de atencin regular y recoger una tarjeta de cupones de GoodRx.  - Si necesita que su receta se enve electrnicamente a una farmacia diferente, informe a nuestra oficina a travs de MyChart de [facility] o por telfono llamando al [phone] y presione la opcin 4.  

## 2022-02-12 NOTE — Progress Notes (Signed)
   Follow-Up Visit   Subjective  Brenda Thornton is a 66 y.o. female who presents for the following: Rash (Intermammary and inframammary x 6 days. Painful and itchy. Cyst excision of the left medial breast recently and steri strips with Mastisol adhesive applied 8 days ago.).   The following portions of the chart were reviewed this encounter and updated as appropriate:       Review of Systems:  No other skin or systemic complaints except as noted in HPI or Assessment and Plan.  Objective  Well appearing patient in no apparent distress; mood and affect are within normal limits.  A focused examination was performed including face, chest. Relevant physical exam findings are noted in the Assessment and Plan.  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  If not improving, consider clobetasol cream.  Recheck on f/u. If improving, will cancel appt.   Topical steroids (such as triamcinolone, fluocinolone, fluocinonide, mometasone, clobetasol, halobetasol, betamethasone, hydrocortisone) can cause thinning and lightening of the skin if they are used for too long in the same area. Your physician has selected the right strength medicine for your problem and area affected on the body. Please use your medication only as directed by your physician to prevent side effects.    mometasone (ELOCON) 0.1 % cream - intermammary Apply 1 Application topically daily as needed (Rash). Apply to rash on chest twice a day until improved.   Return in about 2 weeks (around 02/26/2022) for rash f/u.  IJamesetta Orleans, CMA, am acting as scribe for [name], MD .  Documentation: I have reviewed the above documentation for accuracy and completeness,  and I agree with the above.  [name] MD

## 2022-02-18 ENCOUNTER — Ambulatory Visit: Payer: No Typology Code available for payment source | Admitting: Dermatology

## 2022-02-18 DIAGNOSIS — R21 Rash and other nonspecific skin eruption: Secondary | ICD-10-CM

## 2022-02-18 DIAGNOSIS — L239 Allergic contact dermatitis, unspecified cause: Secondary | ICD-10-CM

## 2022-02-18 DIAGNOSIS — L309 Dermatitis, unspecified: Secondary | ICD-10-CM | POA: Diagnosis not present

## 2022-02-18 MED ORDER — PREDNISONE 5 MG PO TABS: 5.0000 mg | ORAL_TABLET | Freq: Every day | ORAL | 0 refills | Status: DC

## 2022-02-18 NOTE — Progress Notes (Signed)
Follow-Up Visit   Subjective  Brenda Thornton is a 66 y.o. 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 Restarted HCTZ about 2 weeks ago after being off of it for a couple months.   The following portions of the chart were reviewed this encounter and updated as appropriate:       Review of Systems:  No other skin or systemic complaints except as noted in HPI or Assessment and Plan.  Objective  Well appearing patient in no apparent distress; mood and affect are within normal limits.  A focused examination was performed including intermammary, back, chest, neck, scalp, face, buttocks, legs. Relevant physical exam findings are noted in the Assessment and Plan.  L medial knee, R spinal lower back, face, neck, post shoulders, lower back, hips Pink paps periocular, scattered crusted paps shoulders, lower back, gluteal cleft, violaceous scaly patch L hip, erythema on the ears whitish reticulated patch L buccal mucosa Edematous pink macules with central discoloration c/w target lesions                     intermammary Hyperpigmented patch with decreased erythema    Assessment & Plan  Rash face, neck, post shoulders, lower back, hips; L medial knee; R spinal lower back  Erythema Multiforme vs Drug eruption vs other, possibly due to HCTZ  Punch bx x 2 today  Start Prednisone 2 week taper as directed- instructions given to patient Restart Mometasone cream to aa qd/bid prn itch D/c Hydrochlorothiazide   Risks of prednisone taper discussed including mood irritability, insomnia, weight gain, stomach ulcers, increased risk of infection, increased blood sugar (diabetes), hypertension,  osteoporosis with long-term or frequent use, and rare risk of avascular necrosis of the hip.    Skin / nail biopsy - L medial knee Type of biopsy: punch   Informed consent: discussed and consent obtained   Anesthesia: the lesion was anesthetized in a standard fashion   Anesthesia comment:  Area prepped with alcohol Anesthetic:  1% lidocaine w/ epinephrine 1-100,000 buffered w/ 8.4% NaHCO3 Punch size:  3.5 mm Suture size:  4-0 Suture type: nylon   Hemostasis achieved with: suture and pressure   Outcome: patient tolerated procedure well   Post-procedure details: wound care instructions given   Post-procedure details comment:  Ointment and small bandage applied  Skin / nail biopsy - R spinal lower back Type of biopsy: punch   Informed consent: discussed and consent obtained   Anesthesia: the lesion was anesthetized in a standard fashion   Anesthesia comment:  Area prepped with alcohol Anesthetic:  1% lidocaine w/ epinephrine 1-100,000 buffered w/ 8.4% NaHCO3 Punch size:  3.5 mm Suture size:  4-0 Suture type: nylon   Hemostasis achieved with: suture and pressure   Outcome: patient tolerated procedure well   Post-procedure details: wound care instructions given   Post-procedure details comment:  Ointment and small bandage applied  Specimen 1 - Surgical pathology Differential Diagnosis: D48.5 Erythema multiforme vs Drug eruption vs other  Check Margins: No Edematous pink macules with central discoloration c/w target lesions   Specimen 2 - Surgical pathology Differential Diagnosis: Erythema multiforme vs Drug eruption vs other  Check Margins: No Edematous pink macules with central discoloration c/w target  lesions   Related Medications predniSONE (DELTASONE) 5 MG tablet Take 1 tablet (5 mg total) by mouth daily with breakfast. Take 2 week taper as directed, pt given instructions on how to take  Allergic contact dermatitis, unspecified trigger intermammary  2ndary to  adhesives Mastisol adhesive vs bandaid vs tape adhesive Improving   Restart Mometasone cream qd/bid until clear   Return in about 1 week (around 02/25/2022) for bx f/u.  I, [name], RMA, am acting as scribe for [name], MD .  Documentation: I have reviewed the above documentation for accuracy and completeness, and I agree with the above.  [name] MD

## 2022-02-18 NOTE — Patient Instructions (Addendum)
2 Week Prednisone Taper  You will be given a prescription for 100 tablets of oral Prednisone. It is very important that you take this according to the exact schedule provided below. This type of regimen for taking medication is often called a "taper", because your dosage will steadily decrease over a two week period until it is discontinued altogether.  ALWAYS take this medicine with food to prevent it from irritating your stomach. You should also take your Prednisone during morning hours.  Call the clinic at [phone] if you gain more than two pounds in one day, notice swelling anywhere on your body, have shortness of breath, black or red bowel movements, brown or red vomitus, desire to drink large amounts of fluids, a fever, or extreme weakness.   Oral Prednisone over Two Weeks  Day  Week 1  Week '2   1  12 '$ tablets  7 tablets   2  12 tablets  6 tablets   3  11 tablets  5 tablets   4  10 tablets  4 tablets   5  10 tablets  3 tablets   6  9 tablets  2 tablets   7  8 tablets  1 tablet    Risks of prednisone taper discussed including mood irritability, insomnia, weight gain, stomach ulcers, increased risk of infection, increased blood sugar (diabetes), hypertension, osteoporosis with long-term or frequent use, and rare risk of avascular necrosis of the hip.     Wound Care Instructions  Cleanse wound gently with soap and water once a day then pat dry with clean gauze. Apply a thin coat of Petrolatum (petroleum jelly, "Vaseline") over the wound (unless you have an allergy to this). We recommend that you use a new, sterile tube of Vaseline. Do not pick or remove scabs. Do not remove the yellow or white "healing tissue" from the base of the wound.  Cover the wound with fresh, clean, nonstick gauze and secure with paper tape. You may use Band-Aids in place of gauze and tape if the wound is small enough, but would recommend trimming much of the tape off as there is often too much. Sometimes  Band-Aids can irritate the skin.  You should call the office for your biopsy report after 1 week if you have not already been contacted.  If you experience any problems, such as abnormal amounts of bleeding, swelling, significant bruising, significant pain, or evidence of infection, please call the office immediately.  FOR ADULT SURGERY PATIENTS: If you need something for pain relief you may take 1 extra strength Tylenol (acetaminophen) AND 2 Ibuprofen ('200mg'$  each) together every 4 hours as needed for pain. (do not take these if you are allergic to them or if you have a reason you should not take them.) Typically, you may only need pain medication for 1 to 3 days.             Due to recent changes in healthcare laws, you may see results of your pathology and/or laboratory studies on MyChart before the doctors have had a chance to review them. We understand that in some cases there may be results that are confusing or concerning to you. Please understand that not all results are received at the same time and often the doctors may need to interpret multiple results in order to provide you with the best plan of care or course of treatment. Therefore, we ask that you please give [place] 2 business days to thoroughly review all your results before  contacting the office for clarification. Should we see a critical lab result, you will be contacted sooner.   If You Need Anything After Your Visit  If you have any questions or concerns for your doctor, please call our main line at [phone] and press option 4 to reach your doctor's medical assistant. If no one answers, please leave a voicemail as directed and we will return your call as soon as possible. Messages left after 4 pm will be answered the following business day.   You may also send [place] a message via [place]. We typically respond to MyChart messages within 1-2 business days.  For prescription refills, please ask your pharmacy to contact our  office. Our fax number is [phone].  If you have an urgent issue when the clinic is closed that cannot wait until the next business day, you can page your doctor at the number below.    Please note that while we do our best to be available for urgent issues outside of office hours, we are not available 24/7.   If you have an urgent issue and are unable to reach [place], you may choose to seek medical care at your doctor's office, retail clinic, urgent care center, or emergency room.  If you have a medical emergency, please immediately call 911 or go to the emergency department.  Pager Numbers  - Dr. Nehemiah Massed: [phone]  - Dr. Laurence Ferrari: [phone]  - Dr. Nicole Kindred: [phone]  In the event of inclement weather, please call our main line at [phone] for an update on the status of any delays or closures.  Dermatology Medication Tips: Please keep the boxes that topical medications come in in order to help keep track of the instructions about where and how to use these. Pharmacies typically print the medication instructions only on the boxes and not directly on the medication tubes.   If your medication is too expensive, please contact our office at [phone] option 4 or send [place] a message through [place].   We are unable to tell what your co-pay for medications will be in advance as this is different depending on your insurance coverage. However, we may be able to find a substitute medication at lower cost or fill out paperwork to get insurance to cover a needed medication.   If a prior authorization is required to get your medication covered by your insurance company, please allow [place] 1-2 business days to complete this process.  Drug prices often vary depending on where the prescription is filled and some pharmacies may offer cheaper prices.  The website www.goodrx.com contains coupons for medications through different pharmacies. The prices here do not account for what the cost may  be with help from insurance (it may be cheaper with your insurance), but the website can give you the price if you did not use any insurance.  - You can print the associated coupon and take it with your prescription to the pharmacy.  - You may also stop by our office during regular business hours and pick up a GoodRx coupon card.  - If you need your prescription sent electronically to a different pharmacy, notify our office through [facility] or by phone at [phone] option 4.     Si Usted Necesita Algo Despus de Su Visita  Tambin puede enviarnos un mensaje a travs de Pharmacist, community. Por lo general respondemos a los mensajes de MyChart en el transcurso de 1 a 2 das hbiles.  Para renovar recetas, por favor pida a  su farmacia que se ponga en contacto con nuestra oficina. Harland Dingwall de fax es [place] [phone].  Si tiene un asunto urgente cuando la clnica est cerrada y que no puede esperar hasta el siguiente da hbil, puede llamar/localizar a su doctor(a) al nmero que aparece a continuacin.   Por favor, tenga en cuenta que aunque hacemos todo lo posible para estar disponibles para asuntos urgentes fuera del horario de [place], no estamos disponibles las 24 horas del da, los 7 das de la [place].   Si tiene un problema urgente y no puede comunicarse con nosotros, puede optar por buscar atencin mdica  en el consultorio de su doctor(a), en una clnica privada, en un centro de atencin urgente o en una sala de emergencias.  Si tiene Engineering geologist, por favor llame inmediatamente al 911 o vaya a la sala de emergencias.  Nmeros de bper  - Dr. Nehemiah Massed: [phone]  - Dra. Moye: [phone]  - Dra. Nicole Kindred: [phone]  En caso de inclemencias del [place], por favor llame a Johnsie Kindred principal al [phone] para una actualizacin sobre el [place] de cualquier retraso o cierre.  Consejos para la medicacin en dermatologa: Por favor, guarde las cajas en las  que vienen los medicamentos de uso tpico para ayudarle a seguir las instrucciones sobre dnde y cmo usarlos. Las farmacias generalmente imprimen las instrucciones del medicamento slo en las cajas y no directamente en los tubos del [place].   Si su medicamento es muy caro, por favor, pngase en contacto con Zigmund Daniel llamando al [phone] y presione la opcin 4 o envenos un mensaje a travs de Pharmacist, community.   No podemos decirle cul ser su copago por los medicamentos por adelantado ya que esto es diferente dependiendo de la cobertura de su seguro. Sin embargo, es posible que podamos encontrar un medicamento sustituto a Electrical engineer un formulario para que el seguro cubra el medicamento que se considera necesario.   Si se requiere una autorizacin previa para que su compaa de seguros [place] su medicamento, por favor permtanos de 1 a 2 das hbiles para completar este proceso.  Los precios de los medicamentos varan con frecuencia dependiendo del Environmental consultant de dnde se surte la receta y alguna farmacias pueden ofrecer precios ms baratos.  El sitio web www.goodrx.com tiene cupones para medicamentos de Airline pilot. Los precios aqu no tienen en cuenta lo que podra costar con la ayuda del seguro (puede ser ms barato con su seguro), pero el sitio web puede darle el precio si no utiliz Research scientist (physical sciences).  - Puede imprimir el cupn correspondiente y llevarlo con su receta a la farmacia.  - Tambin puede pasar por nuestra oficina durante el horario de atencin regular y Charity fundraiser una tarjeta de cupones de GoodRx.  - Si necesita que su receta se enve electrnicamente a una farmacia diferente, informe a nuestra oficina a travs de MyChart de [place] o por telfono llamando al [phone] y presione la opcin 4.

## 2022-02-19 ENCOUNTER — Ambulatory Visit: Payer: No Typology Code available for payment source | Admitting: Dermatology

## 2022-02-20 ENCOUNTER — Telehealth: Payer: Self-pay

## 2022-02-20 NOTE — Telephone Encounter (Signed)
Patient returned sonya's call and I notified her of biopsy results AF

## 2022-02-20 NOTE — Telephone Encounter (Signed)
-----   Message from [name], MD sent at 02/19/2022  6:29 PM EDT ----- 1. Skin , left medial knee INTERFACE DERMATITIS CONSISTENT WITH ERYTHEMA MULTIFORME 2. Skin , right spinal lower back INTERFACE DERMATITIS CONSISTENT WITH ERYTHEMA MULTIFORME  Erythema Multiforme - type of inflammatory skin reaction due to infection (like HSV) or medications. This may be due to recent restart of HCTZ prior to onset of rash.  Continue avoidance HCTZ.  Continue 2 week prednisone taper. Keep appt for suture removal  - please call patient

## 2022-02-20 NOTE — Telephone Encounter (Signed)
Left pt msg to call for bx results/sh 

## 2022-02-25 ENCOUNTER — Ambulatory Visit: Payer: No Typology Code available for payment source | Admitting: Dermatology

## 2022-02-25 DIAGNOSIS — L905 Scar conditions and fibrosis of skin: Secondary | ICD-10-CM | POA: Diagnosis not present

## 2022-02-25 DIAGNOSIS — L231 Allergic contact dermatitis due to adhesives: Secondary | ICD-10-CM

## 2022-02-25 DIAGNOSIS — L519 Erythema multiforme, unspecified: Secondary | ICD-10-CM

## 2022-02-25 NOTE — Progress Notes (Signed)
   Follow-Up Visit   Subjective  Brenda Thornton is a 66 y.o. female who presents for the following: Follow-up (Patient here today for suture removal and biopsy follow up. ).  Patient on 2 week prednisone taper and using mometasone cream. Patient improved.   The following portions of the chart were reviewed this encounter and updated as appropriate:       Review of Systems:  No other skin or systemic complaints except as noted in HPI or Assessment and Plan.  Objective  Well appearing patient in no apparent distress; mood and affect are within normal limits.  A focused examination was performed including face, legs, trunk. Relevant physical exam findings are noted in the Assessment and Plan.  face, trunk, extremities Light violaceous macules with mild crusting at lower back, posterior shoulders, posterior neck, minimal at legs Mild erythema periocular, decreased edema Light violaceous scaly patch at left hip  mid chest Linear firm papule at mid chest c/w hypertrophic scar  mid chest Hyperpigmented patch, otherwise clear    Assessment & Plan  Erythema multiforme face, trunk, extremities  Biopsy proven, probable reaction to HCTZ, improving  Wound cleansed, sutures removed, wound cleansed and steri strips applied. Discussed pathology results.   Continue 2 wk prednisone taper until finished. Continue to avoid HCTZ. Continue mometasone cream 1-2 times daily to aas body as needed for itch. Avoid applying to face, groin, and axilla. Use as directed. Long-term use can cause thinning of the skin.  Topical steroids (such as triamcinolone, fluocinolone, fluocinonide, mometasone, clobetasol, halobetasol, betamethasone, hydrocortisone) can cause thinning and lightening of the skin if they are used for too long in the same area. Your physician has selected the right strength medicine for your problem and area affected on the body. Please use your medication only as directed by your  physician to prevent side effects.      Scar mid chest  Recommend Serica moisturizing scar formula cream every night or [facility] brand or Mederma silicone scar sheet every night for the first year after a scar appears to help with scar remodeling if desired. Scars remodel on their own for a full year and will gradually improve in appearance over time.   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 Apply to rash on chest twice a day until improved.   Return if symptoms worsen or fail to improve.  Graciella Belton, RMA, am acting as scribe for [name], MD .  Documentation: I have reviewed the above documentation for accuracy and completeness, and I agree with the above.  [name] MD

## 2022-02-25 NOTE — Patient Instructions (Addendum)
Biopsy proven  Continue prednisone taper until finished. Continue mometasone cream 1-2 times daily as needed for itch. Avoid applying to face, groin, and axilla. Use as directed. Long-term use can cause thinning of the skin.  Topical steroids (such as triamcinolone, fluocinolone, fluocinonide, mometasone, clobetasol, halobetasol, betamethasone, hydrocortisone) can cause thinning and lightening of the skin if they are used for too long in the same area. Your physician has selected the right strength medicine for your problem and area affected on the body. Please use your medication only as directed by your physician to prevent side effects.    Due to recent changes in healthcare laws, you may see results of your pathology and/or laboratory studies on MyChart before the doctors have had a chance to review them. We understand that in some cases there may be results that are confusing or concerning to you. Please understand that not all results are received at the same time and often the doctors may need to interpret multiple results in order to provide you with the best plan of care or course of treatment. Therefore, we ask that you please give [place] 2 business days to thoroughly review all your results before contacting the office for clarification. Should we see a critical lab result, you will be contacted sooner.   If You Need Anything After Your Visit  If you have any questions or concerns for your doctor, please call our main line at [phone] and press option 4 to reach your doctor's medical assistant. If no one answers, please leave a voicemail as directed and we will return your call as soon as possible. Messages left after 4 pm will be answered the following business day.   You may also send [place] a message via [place]. We typically respond to MyChart messages within 1-2 business days.  For prescription refills, please ask your pharmacy to contact our office. Our fax number is [phone].  If  you have an urgent issue when the clinic is closed that cannot wait until the next business day, you can page your doctor at the number below.    Please note that while we do our best to be available for urgent issues outside of office hours, we are not available 24/7.   If you have an urgent issue and are unable to reach [place], you may choose to seek medical care at your doctor's office, retail clinic, urgent care center, or emergency room.  If you have a medical emergency, please immediately call 911 or go to the emergency department.  Pager Numbers  - Dr. Nehemiah Massed: [phone]  - Dr. Laurence Ferrari: [phone]  - Dr. Nicole Kindred: [phone]  In the event of inclement weather, please call our main line at [phone] for an update on the status of any delays or closures.  Dermatology Medication Tips: Please keep the boxes that topical medications come in in order to help keep track of the instructions about where and how to use these. Pharmacies typically print the medication instructions only on the boxes and not directly on the medication tubes.   If your medication is too expensive, please contact our office at [phone] option 4 or send [place] a message through [place].   We are unable to tell what your co-pay for medications will be in advance as this is different depending on your insurance coverage. However, we may be able to find a substitute medication at lower cost or fill out paperwork to get insurance to cover a needed medication.   If a prior  authorization is required to get your medication covered by your insurance company, please allow [place] 1-2 business days to complete this process.  Drug prices often vary depending on where the prescription is filled and some pharmacies may offer cheaper prices.  The website www.goodrx.com contains coupons for medications through different pharmacies. The prices here do not account for what the cost may be with help from insurance (it may be cheaper  with your insurance), but the website can give you the price if you did not use any insurance.  - You can print the associated coupon and take it with your prescription to the pharmacy.  - You may also stop by our office during regular business hours and pick up a GoodRx coupon card.  - If you need your prescription sent electronically to a different pharmacy, notify our office through [facility] or by phone at [phone] option 4.     Si Usted Necesita Algo Despus de Su Visita  Tambin puede enviarnos un mensaje a travs de Pharmacist, community. Por lo general respondemos a los mensajes de MyChart en el transcurso de 1 a 2 das hbiles.  Para renovar recetas, por favor pida a su farmacia que se ponga en contacto con nuestra oficina. Harland Dingwall de fax es [place] [phone].  Si tiene un asunto urgente cuando la clnica est cerrada y que no puede esperar hasta el siguiente da hbil, puede llamar/localizar a su doctor(a) al nmero que aparece a continuacin.   Por favor, tenga en cuenta que aunque hacemos todo lo posible para estar disponibles para asuntos urgentes fuera del horario de [place], no estamos disponibles las 24 horas del da, los 7 das de la [place].   Si tiene un problema urgente y no puede comunicarse con nosotros, puede optar por buscar atencin mdica  en el consultorio de su doctor(a), en una clnica privada, en un centro de atencin urgente o en una sala de emergencias.  Si tiene Engineering geologist, por favor llame inmediatamente al 911 o vaya a la sala de emergencias.  Nmeros de bper  - Dr. Nehemiah Massed: [phone]  - Dra. Moye: [phone]  - Dra. Nicole Kindred: [phone]  En caso de inclemencias del [place], por favor llame a Johnsie Kindred principal al [phone] para una actualizacin sobre el [place] de cualquier retraso o cierre.  Consejos para la medicacin en dermatologa: Por favor, guarde las cajas en las que vienen los medicamentos de uso tpico para  ayudarle a seguir las instrucciones sobre dnde y cmo usarlos. Las farmacias generalmente imprimen las instrucciones del medicamento slo en las cajas y no directamente en los tubos del [place].   Si su medicamento es muy caro, por favor, pngase en contacto con Zigmund Daniel llamando al [phone] y presione la opcin 4 o envenos un mensaje a travs de Pharmacist, community.   No podemos decirle cul ser su copago por los medicamentos por adelantado ya que esto es diferente dependiendo de la cobertura de su seguro. Sin embargo, es posible que podamos encontrar un medicamento sustituto a Electrical engineer un formulario para que el seguro cubra el medicamento que se considera necesario.   Si se requiere una autorizacin previa para que su compaa de seguros [place] su medicamento, por favor permtanos de 1 a 2 das hbiles para completar este proceso.  Los precios de los medicamentos varan con frecuencia dependiendo del Environmental consultant de dnde se surte la receta y alguna farmacias pueden ofrecer precios ms baratos.  El sitio web www.goodrx.com tiene cupones para  medicamentos de diferentes farmacias. Los precios aqu no tienen en cuenta lo que podra costar con la ayuda del seguro (puede ser ms barato con su seguro), pero el sitio web puede darle el precio si no utiliz ningn seguro.  - Puede imprimir el cupn correspondiente y llevarlo con su receta a la farmacia.  - Tambin puede pasar por nuestra oficina durante el horario de atencin regular y recoger una tarjeta de cupones de GoodRx.  - Si necesita que su receta se enve electrnicamente a una farmacia diferente, informe a nuestra oficina a travs de MyChart de [place] o por telfono llamando al [phone] y presione la opcin 4.  

## 2022-02-27 ENCOUNTER — Ambulatory Visit: Payer: No Typology Code available for payment source | Admitting: Dermatology

## 2022-03-15 DIAGNOSIS — H9201 Otalgia, right ear: Secondary | ICD-10-CM | POA: Diagnosis not present

## 2022-04-04 DIAGNOSIS — H9201 Otalgia, right ear: Secondary | ICD-10-CM | POA: Diagnosis not present

## 2022-04-04 DIAGNOSIS — H6121 Impacted cerumen, right ear: Secondary | ICD-10-CM | POA: Diagnosis not present

## 2022-04-24 ENCOUNTER — Other Ambulatory Visit: Payer: Self-pay | Admitting: Family Medicine

## 2022-04-24 DIAGNOSIS — Z1231 Encounter for screening mammogram for malignant neoplasm of breast: Secondary | ICD-10-CM

## 2022-05-08 DIAGNOSIS — I10 Essential (primary) hypertension: Secondary | ICD-10-CM | POA: Diagnosis not present

## 2022-05-08 DIAGNOSIS — Z1211 Encounter for screening for malignant neoplasm of colon: Secondary | ICD-10-CM | POA: Diagnosis not present

## 2022-05-08 DIAGNOSIS — Z6836 Body mass index (BMI) 36.0-36.9, adult: Secondary | ICD-10-CM | POA: Diagnosis not present

## 2022-05-08 DIAGNOSIS — Z008 Encounter for other general examination: Secondary | ICD-10-CM | POA: Diagnosis not present

## 2022-05-29 ENCOUNTER — Ambulatory Visit
Admission: RE | Admit: 2022-05-29 | Discharge: 2022-05-29 | Disposition: A | Discharge status: Home / Self Care | Payer: No Typology Code available for payment source | Source: Ambulatory Visit | Attending: Family Medicine | Admitting: Family Medicine

## 2022-05-29 DIAGNOSIS — Z1231 Encounter for screening mammogram for malignant neoplasm of breast: Secondary | ICD-10-CM | POA: Insufficient documentation

## 2022-08-10 DIAGNOSIS — U071 COVID-19: Secondary | ICD-10-CM | POA: Diagnosis not present

## 2022-08-10 DIAGNOSIS — Z20822 Contact with and (suspected) exposure to covid-19: Secondary | ICD-10-CM | POA: Diagnosis not present

## 2022-08-10 DIAGNOSIS — R059 Cough, unspecified: Secondary | ICD-10-CM | POA: Diagnosis not present

## 2022-08-14 DIAGNOSIS — Z20822 Contact with and (suspected) exposure to covid-19: Secondary | ICD-10-CM | POA: Diagnosis not present

## 2022-11-12 DIAGNOSIS — Z135 Encounter for screening for eye and ear disorders: Secondary | ICD-10-CM | POA: Diagnosis not present

## 2022-11-12 DIAGNOSIS — H524 Presbyopia: Secondary | ICD-10-CM | POA: Diagnosis not present

## 2022-11-12 DIAGNOSIS — H2513 Age-related nuclear cataract, bilateral: Secondary | ICD-10-CM | POA: Diagnosis not present

## 2022-11-12 DIAGNOSIS — H1045 Other chronic allergic conjunctivitis: Secondary | ICD-10-CM | POA: Diagnosis not present

## 2022-11-12 DIAGNOSIS — H52223 Regular astigmatism, bilateral: Secondary | ICD-10-CM | POA: Diagnosis not present

## 2022-11-12 DIAGNOSIS — H5203 Hypermetropia, bilateral: Secondary | ICD-10-CM | POA: Diagnosis not present

## 2022-12-16 DIAGNOSIS — H524 Presbyopia: Secondary | ICD-10-CM | POA: Diagnosis not present

## 2022-12-16 DIAGNOSIS — H52223 Regular astigmatism, bilateral: Secondary | ICD-10-CM | POA: Diagnosis not present

## 2022-12-16 DIAGNOSIS — H1045 Other chronic allergic conjunctivitis: Secondary | ICD-10-CM | POA: Diagnosis not present

## 2022-12-16 DIAGNOSIS — H2513 Age-related nuclear cataract, bilateral: Secondary | ICD-10-CM | POA: Diagnosis not present

## 2022-12-16 DIAGNOSIS — H5203 Hypermetropia, bilateral: Secondary | ICD-10-CM | POA: Diagnosis not present

## 2022-12-16 DIAGNOSIS — Z0100 Encounter for examination of eyes and vision without abnormal findings: Secondary | ICD-10-CM | POA: Diagnosis not present

## 2023-04-21 ENCOUNTER — Other Ambulatory Visit: Payer: Self-pay | Admitting: Family Medicine

## 2023-04-21 DIAGNOSIS — Z1231 Encounter for screening mammogram for malignant neoplasm of breast: Secondary | ICD-10-CM

## 2023-04-30 DIAGNOSIS — M79675 Pain in left toe(s): Secondary | ICD-10-CM | POA: Diagnosis not present

## 2023-04-30 DIAGNOSIS — L600 Ingrowing nail: Secondary | ICD-10-CM | POA: Diagnosis not present

## 2023-04-30 DIAGNOSIS — L03032 Cellulitis of left toe: Secondary | ICD-10-CM | POA: Diagnosis not present

## 2023-04-30 DIAGNOSIS — M79674 Pain in right toe(s): Secondary | ICD-10-CM | POA: Diagnosis not present

## 2023-04-30 DIAGNOSIS — B351 Tinea unguium: Secondary | ICD-10-CM | POA: Diagnosis not present

## 2023-05-07 DIAGNOSIS — Z96643 Presence of artificial hip joint, bilateral: Secondary | ICD-10-CM | POA: Diagnosis not present

## 2023-05-07 DIAGNOSIS — M545 Low back pain, unspecified: Secondary | ICD-10-CM | POA: Diagnosis not present

## 2023-05-07 DIAGNOSIS — M160 Bilateral primary osteoarthritis of hip: Secondary | ICD-10-CM | POA: Diagnosis not present

## 2023-06-02 ENCOUNTER — Ambulatory Visit
Admission: RE | Admit: 2023-06-02 | Discharge: 2023-06-02 | Disposition: A | Discharge status: Home / Self Care | Payer: No Typology Code available for payment source | Source: Ambulatory Visit | Attending: Family Medicine | Admitting: Family Medicine

## 2023-06-02 DIAGNOSIS — Z1231 Encounter for screening mammogram for malignant neoplasm of breast: Secondary | ICD-10-CM | POA: Insufficient documentation

## 2023-06-21 IMAGING — MG MM DIGITAL SCREENING BILAT W/ TOMO AND CAD
8 of 14 series · 8 of 40 positions shown · non-contrast
Comparison: Previous exam(s).

CLINICAL DATA: Screening.

EXAM:
DIGITAL SCREENING BILATERAL MAMMOGRAM WITH TOMOSYNTHESIS AND CAD
TECHNIQUE: Bilateral screening digital craniocaudal and mediolateral oblique
mammograms were obtained. Bilateral screening digital breast
tomosynthesis was performed. The images were evaluated with
computer-aided detection.

[L CC synth-2D]
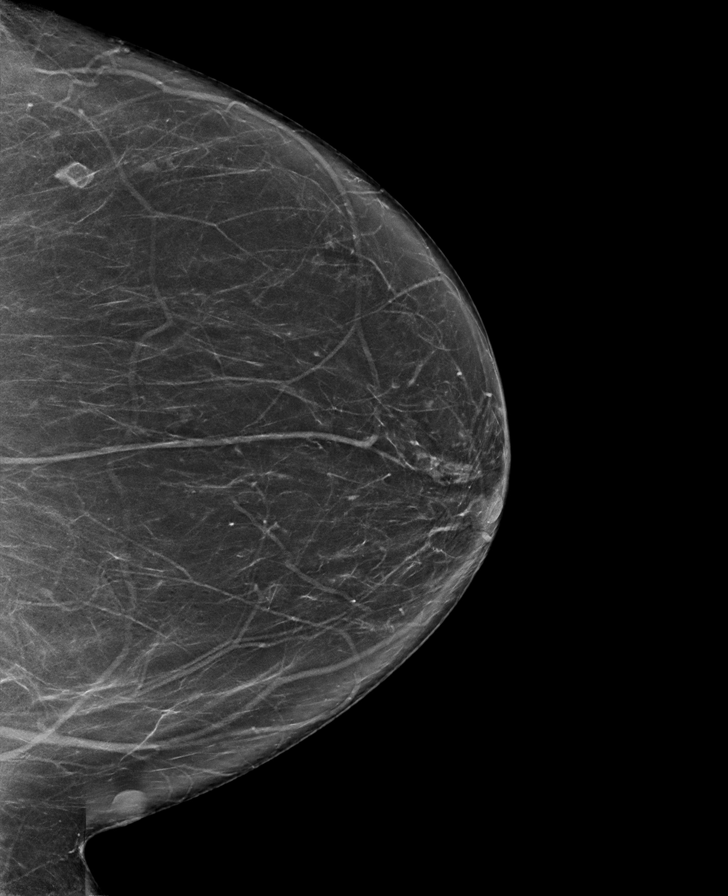

[R CC synth-2D]
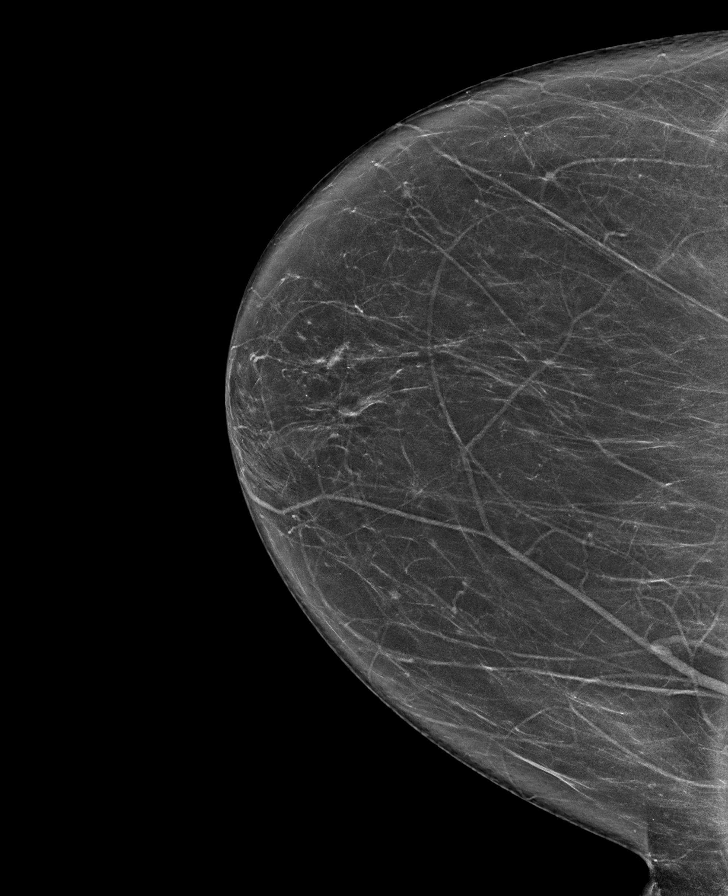

[L CV synth-2D]
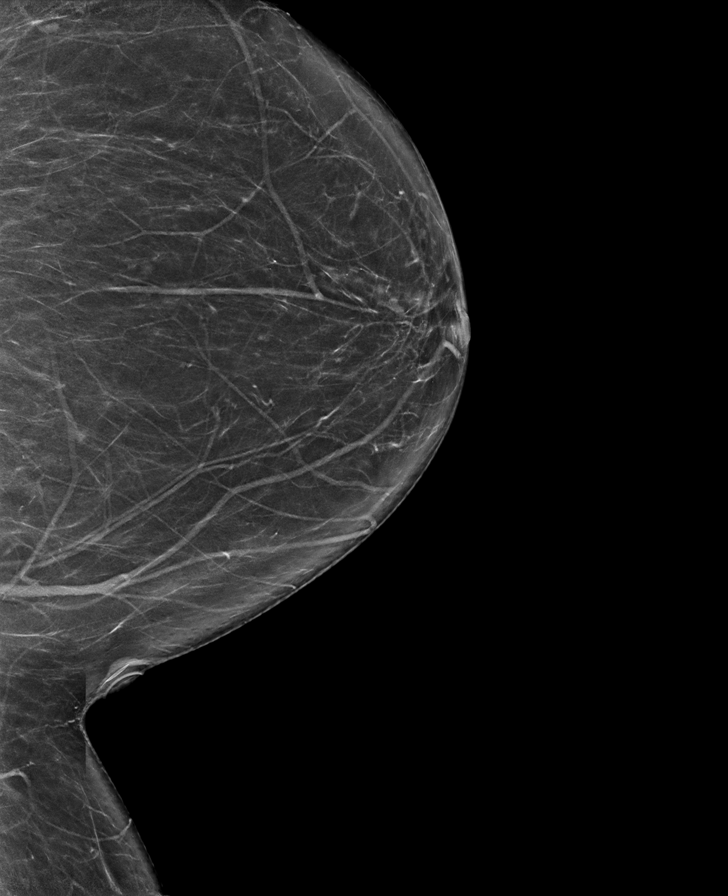

[R MLO synth-2D (1 of 2)]
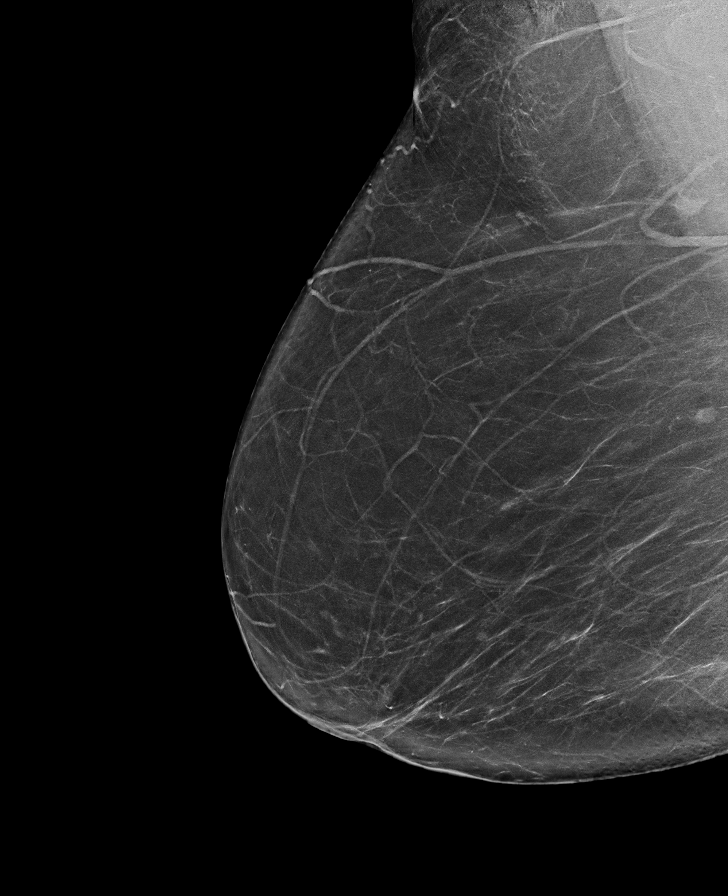

[R MLO synth-2D (2 of 2)]
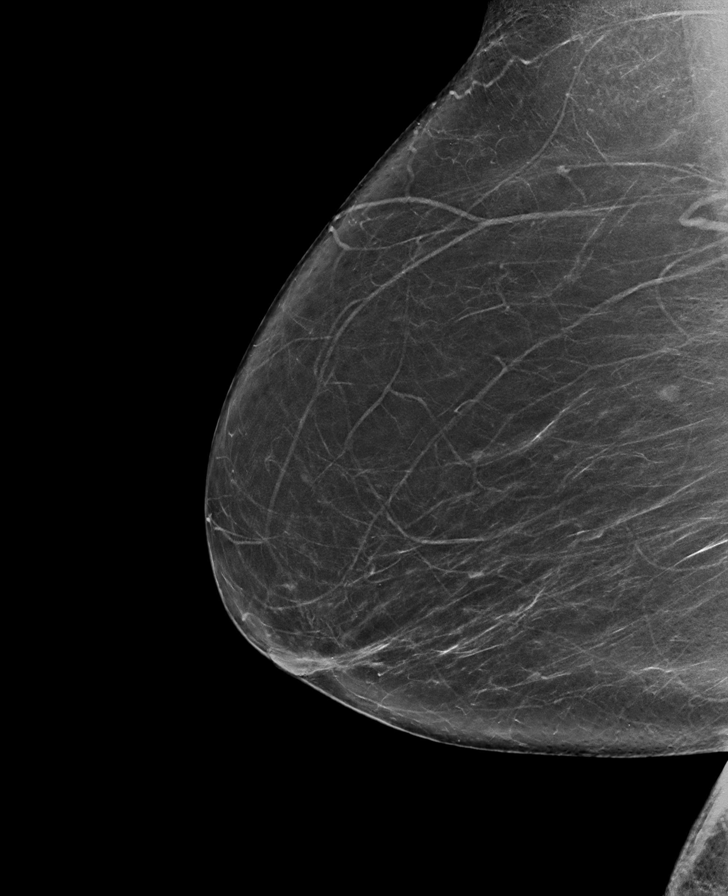

[L MLO synth-2D (1 of 2)]
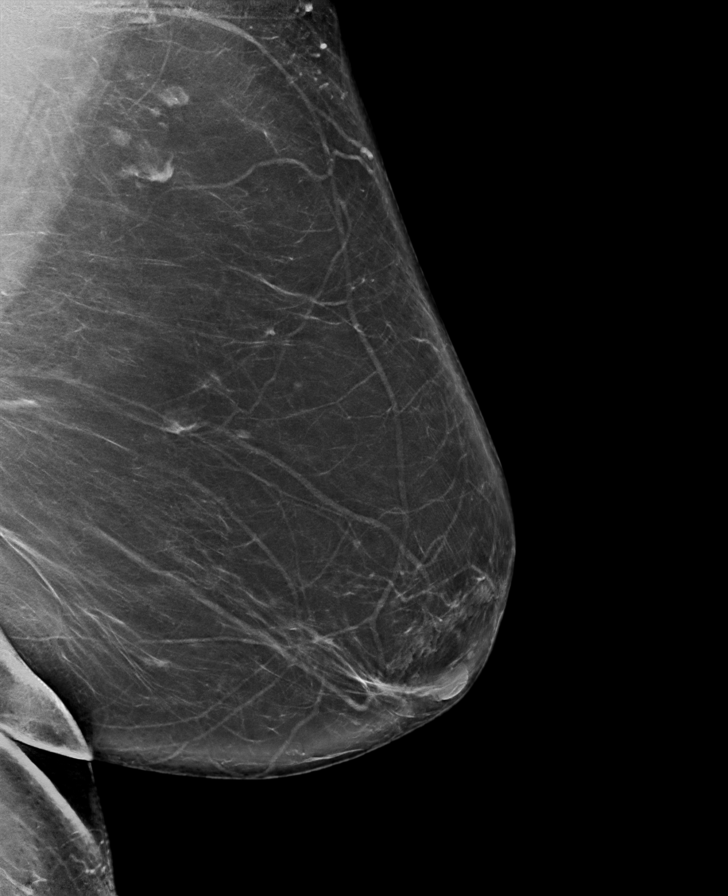

[L MLO synth-2D (2 of 2)]
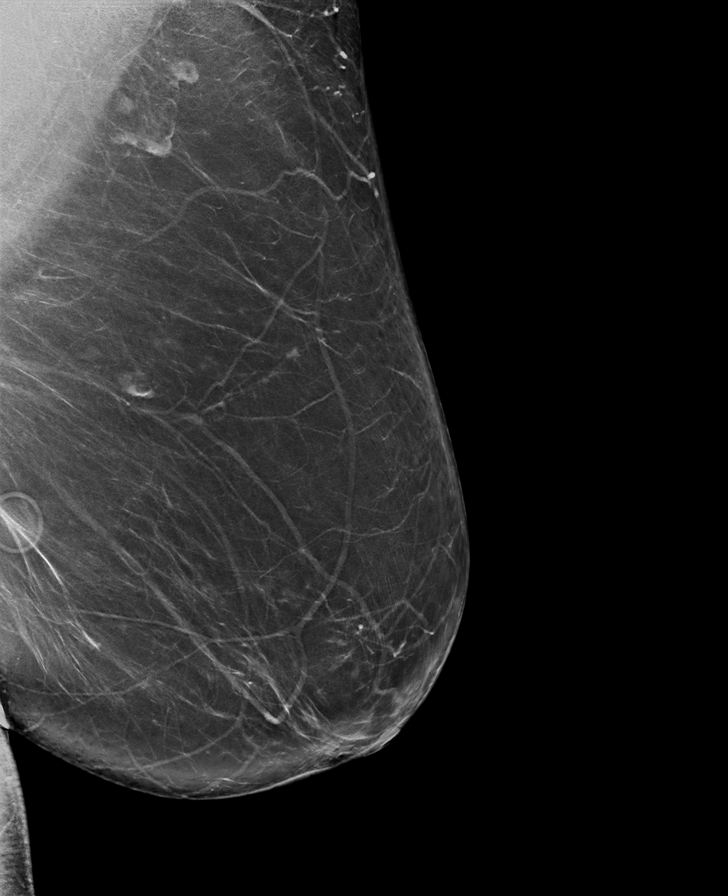

[R MLO tomo · tomo slice 40/79.0]
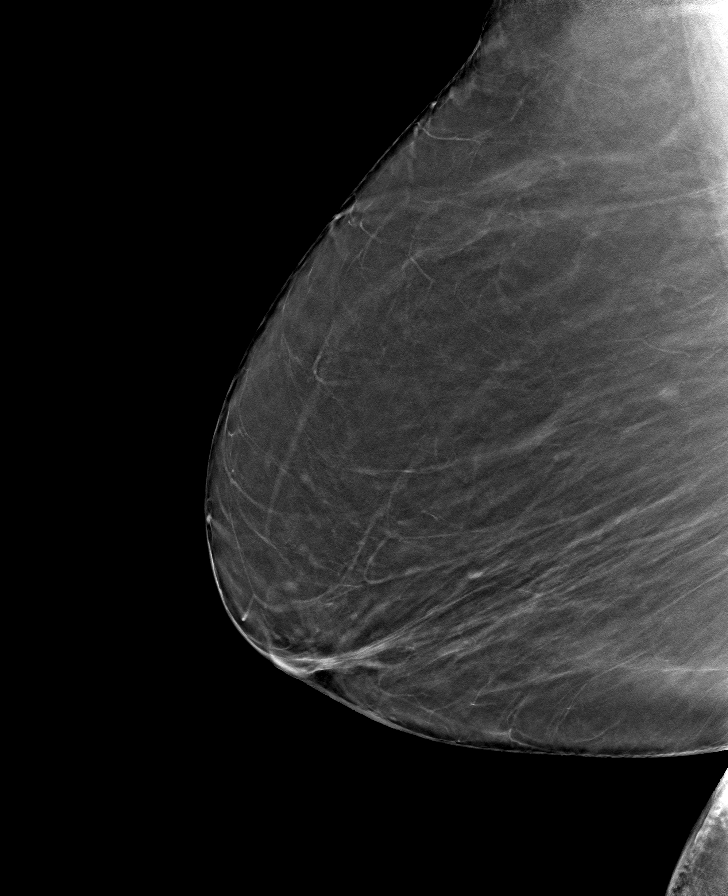

[8 of 40 positions shown; findings below may reference images not displayed]

ACR Breast Density Category b: There are scattered areas of
fibroglandular density.
FINDINGS: There are no findings suspicious for malignancy.
IMPRESSION: No mammographic evidence of malignancy. A result letter of this
screening mammogram will be mailed directly to the patient.

RECOMMENDATION:
Screening mammogram in one year. (Code:51-O-LD2)

BI-RADS CATEGORY  1: Negative.

## 2023-07-07 ENCOUNTER — Other Ambulatory Visit: Payer: Self-pay

## 2023-07-07 ENCOUNTER — Emergency Department: Payer: No Typology Code available for payment source

## 2023-07-07 DIAGNOSIS — K5900 Constipation, unspecified: Secondary | ICD-10-CM | POA: Diagnosis present

## 2023-07-07 DIAGNOSIS — K5732 Diverticulitis of large intestine without perforation or abscess without bleeding: Secondary | ICD-10-CM | POA: Insufficient documentation

## 2023-07-07 DIAGNOSIS — K449 Diaphragmatic hernia without obstruction or gangrene: Secondary | ICD-10-CM | POA: Diagnosis not present

## 2023-07-07 DIAGNOSIS — N281 Cyst of kidney, acquired: Secondary | ICD-10-CM | POA: Diagnosis not present

## 2023-07-07 DIAGNOSIS — R1032 Left lower quadrant pain: Secondary | ICD-10-CM | POA: Diagnosis not present

## 2023-07-07 DIAGNOSIS — I10 Essential (primary) hypertension: Secondary | ICD-10-CM | POA: Insufficient documentation

## 2023-07-07 DIAGNOSIS — R194 Change in bowel habit: Secondary | ICD-10-CM | POA: Diagnosis not present

## 2023-07-07 DIAGNOSIS — K5792 Diverticulitis of intestine, part unspecified, without perforation or abscess without bleeding: Secondary | ICD-10-CM | POA: Diagnosis not present

## 2023-07-07 DIAGNOSIS — R1031 Right lower quadrant pain: Secondary | ICD-10-CM | POA: Diagnosis not present

## 2023-07-07 DIAGNOSIS — D3501 Benign neoplasm of right adrenal gland: Secondary | ICD-10-CM | POA: Diagnosis not present

## 2023-07-07 LAB — CBC WITH DIFFERENTIAL/PLATELET
Abs Immature Granulocytes: 0.04 10*3/uL (ref 0.00–0.07)
Basophils Absolute: 0.1 10*3/uL (ref 0.0–0.1)
Basophils Relative: 1 %
Eosinophils Absolute: 0.1 10*3/uL (ref 0.0–0.5)
Eosinophils Relative: 1 %
HCT: 42.4 % (ref 36.0–46.0)
Hemoglobin: 14 g/dL (ref 12.0–15.0)
Immature Granulocytes: 0 %
Lymphocytes Relative: 21 %
Lymphs Abs: 2.1 10*3/uL (ref 0.7–4.0)
MCH: 26.8 pg (ref 26.0–34.0)
MCHC: 33 g/dL (ref 30.0–36.0)
MCV: 81.1 fL (ref 80.0–100.0)
Monocytes Absolute: 0.8 10*3/uL (ref 0.1–1.0)
Monocytes Relative: 8 %
Neutro Abs: 6.9 10*3/uL (ref 1.7–7.7)
Neutrophils Relative %: 69 %
Platelets: 255 10*3/uL (ref 150–400)
RBC: 5.23 MIL/uL — ABNORMAL HIGH (ref 3.87–5.11)
RDW: 14.2 % (ref 11.5–15.5)
WBC: 10.1 10*3/uL (ref 4.0–10.5)
nRBC: 0.2 % (ref 0.0–0.2)

## 2023-07-07 LAB — COMPREHENSIVE METABOLIC PANEL
ALT: 13 U/L (ref 0–44)
AST: 21 U/L (ref 15–41)
Albumin: 3.9 g/dL (ref 3.5–5.0)
Alkaline Phosphatase: 89 U/L (ref 38–126)
Anion gap: 14 (ref 5–15)
BUN: 20 mg/dL (ref 8–23)
CO2: 19 mmol/L — ABNORMAL LOW (ref 22–32)
Calcium: 8.8 mg/dL — ABNORMAL LOW (ref 8.9–10.3)
Chloride: 100 mmol/L (ref 98–111)
Creatinine, Ser: 0.86 mg/dL (ref 0.44–1.00)
GFR, Estimated: >60 mL/min (ref >60)
Glucose, Bld: 94 mg/dL (ref 70–99)
Potassium: 3.5 mmol/L (ref 3.5–5.1)
Sodium: 133 mmol/L — ABNORMAL LOW (ref 135–145)
Total Bilirubin: 2.1 mg/dL — ABNORMAL HIGH (ref 0.0–1.2)
Total Protein: 7.8 g/dL (ref 6.5–8.1)

## 2023-07-07 LAB — URINALYSIS, ROUTINE W REFLEX MICROSCOPIC
Bilirubin Urine: NEGATIVE
Glucose, UA: NEGATIVE mg/dL
Hgb urine dipstick: NEGATIVE
Ketones, ur: 20 mg/dL — AB
Leukocytes,Ua: NEGATIVE
Nitrite: NEGATIVE
Protein, ur: NEGATIVE mg/dL
Specific Gravity, Urine: 1.009 (ref 1.005–1.030)
pH: 6 (ref 5.0–8.0)

## 2023-07-07 MED ORDER — IOHEXOL 300 MG/ML  SOLN
100.0000 mL | Freq: Once | INTRAMUSCULAR | Status: AC | PRN
  Administered 2023-07-07: 100 mL via INTRAVENOUS

## 2023-07-07 NOTE — ED Triage Notes (Signed)
Pt c/o constipation since Friday. Pt took gas x and a laxative with small results. Pt c/o lower abdominal pain despite medication. Pt has never had abdominal surgery. Pt went to [facility] clinic and was sent here for CT scan.

## 2023-07-08 ENCOUNTER — Telehealth: Payer: Self-pay

## 2023-07-08 ENCOUNTER — Emergency Department
Admission: EM | Admit: 2023-07-08 | Discharge: 2023-07-08 | Disposition: A | Discharge status: Home / Self Care | Payer: No Typology Code available for payment source | Attending: Emergency Medicine | Admitting: Emergency Medicine

## 2023-07-08 DIAGNOSIS — K5792 Diverticulitis of intestine, part unspecified, without perforation or abscess without bleeding: Secondary | ICD-10-CM

## 2023-07-08 MED ORDER — POLYETHYLENE GLYCOL 3350 17 G PO PACK
17.0000 g | PACK | Freq: Every day | ORAL | 0 refills | Status: AC
Start: 2023-07-08 — End: 2023-08-07

## 2023-07-08 MED ORDER — AMOXICILLIN-POT CLAVULANATE 875-125 MG PO TABS: 1.0000 | ORAL_TABLET | Freq: Two times a day (BID) | ORAL | 0 refills | Status: AC

## 2023-07-08 MED ORDER — METAMUCIL SMOOTH TEXTURE 58.6 % PO POWD: 1.0000 | Freq: Every day | ORAL | 0 refills | Status: AC

## 2023-07-08 MED ORDER — AMOXICILLIN-POT CLAVULANATE 875-125 MG PO TABS
1.0000 | ORAL_TABLET | Freq: Once | ORAL | Status: AC
Start: 2023-07-08 — End: 2023-07-08
  Administered 2023-07-08: 1 via ORAL
  Filled 2023-07-08: qty 1

## 2023-07-08 NOTE — Discharge Instructions (Addendum)
 Take antibiotics as prescribed for the full 5-day course.  Use MiraLAX  and Metamucil as prescribed to help regulate bowel movements and ease constipation.  Thank you for choosing us  for your health care today!  Please see your primary doctor this week for a follow up appointment.   If you have any new, worsening, or unexpected symptoms call your doctor right away or come back to the emergency department for reevaluation.  It was my pleasure to care for you today.   Ginnie EDISON Cyrena, MD

## 2023-07-08 NOTE — ED Provider Notes (Signed)
 [facility] Provider Note    Event Date/Time   First MD Initiated Contact with Patient 07/08/23 [phone]     (approximate)   History   Constipation and Abdominal Pain   HPI  Brenda Thornton is a 67 y.o. female   Past medical history of hypertension here with abdominal pain.  Lower abdominal pain bilaterally.  Also constipated, had a small liquid stool a couple of days ago, is passing flatus, has taken laxative without improvement.  Feels a bloating sensation in the lower abdomen.    Has had subjective fevers and chills  No urinary symptoms.    No history of abdominal surgeries.    No nausea or vomiting.  Independent Historian contributed to assessment above: Her friend is at bedside corroborates information past medical history as above  External Medical Documents Reviewed: Office visit with internal medicine dated July 07, 2023 with the above symptomatology      Physical Exam   Triage Vital Signs: ED Triage Vitals  Encounter Vitals Group     BP 07/07/23 1655 (!) 153/80     Systolic BP Percentile --      Diastolic BP Percentile --      Pulse Rate 07/07/23 1655 93     Resp 07/07/23 1655 19     Temp 07/07/23 1655 97.8 F (36.6 C)     Temp Source 07/07/23 1655 Oral     SpO2 07/07/23 1655 96 %     Weight 07/07/23 1656 222 lb 4.8 oz (100.8 kg)     Height 07/07/23 1656 5' 5 (1.651 m)     Head Circumference --      Peak Flow --      Pain Score 07/07/23 1656 8     Pain Loc --      Pain Education --      Exclude from Growth Chart --     Most recent vital signs: Vitals:   07/08/23 0125 07/08/23 0540  BP: 133/71 (!) 150/96  Pulse: 98 98  Resp: 16 18  Temp: 98.8 F (37.1 C) (!) 97.5 F (36.4 C)  SpO2: 98% 98%    General: Awake, no distress.  CV:  Good peripheral perfusion.  Resp:  Normal effort.  Abd:  No distention.  Other:  Normal vital signs nontoxic comfortable appearing pleasant patient in no acute distress.  She does have  left lower greater than right lower quadrant tenderness to palpation without rigidity or guarding.   ED Results / Procedures / Treatments   Labs (all labs ordered are listed, but only abnormal results are displayed) Labs Reviewed  CBC WITH DIFFERENTIAL/PLATELET - Abnormal; Notable for the following components:      Result Value   RBC 5.23 (*)    All other components within normal limits  COMPREHENSIVE METABOLIC PANEL - Abnormal; Notable for the following components:   Sodium 133 (*)    CO2 19 (*)    Calcium 8.8 (*)    Total Bilirubin 2.1 (*)    All other components within normal limits  URINALYSIS, ROUTINE W REFLEX MICROSCOPIC - Abnormal; Notable for the following components:   Color, Urine YELLOW (*)    APPearance CLEAR (*)    Ketones, ur 20 (*)    All other components within normal limits     I ordered and reviewed the above labs they are notable for white blood cell count is normal.  Urinalysis without bacteria or inflammatory changes.   RADIOLOGY I independently reviewed  and interpreted CT of the abdomen pelvis and see no obvious inflammatory obstructive changes I also reviewed radiologist's formal read.   PROCEDURES:  Critical Care performed: No  Procedures   MEDICATIONS ORDERED IN ED: Medications  amoxicillin -clavulanate (AUGMENTIN ) 875-125 MG per tablet 1 tablet (has no administration in time range)  iohexol  (OMNIPAQUE ) 300 MG/ML solution 100 mL (100 mLs Intravenous Contrast Given 07/07/23 1808)    IMPRESSION / MDM / ASSESSMENT AND PLAN / ED COURSE  I reviewed the triage vital signs and the nursing notes.                                Patient's presentation is most consistent with acute presentation with potential threat to life or bodily function.  Differential diagnosis includes, but is not limited to, obstruction, intra-abdominal infection, colitis   The patient is on the cardiac monitor to evaluate for evidence of arrhythmia and/or significant  heart rate changes.  MDM:    She has had abdominal discomfort in the lower abdomen for several days with constipation/bloating and has left lower quadrant tenderness to palpation and CT scan evidence of uncomplicated diverticulitis.  Plan will be for antibiotics as an outpatient, bowel regimen, and GI referral.  I considered hospitalization for admission or observation however given her nontoxic appearance with normal lab testing and vital signs, I think outpatient therapy and GI follow-up and PMD follow-up is appropriate at this time.  She was given strict return precautions.        FINAL CLINICAL IMPRESSION(S) / ED DIAGNOSES   Final diagnoses:  Diverticulitis     Rx / DC Orders   ED Discharge Orders          Ordered    amoxicillin -clavulanate (AUGMENTIN ) 875-125 MG tablet  2 times daily        07/08/23 0638    polyethylene glycol (MIRALAX ) 17 g packet  Daily        07/08/23 0638    psyllium (METAMUCIL SMOOTH TEXTURE) 58.6 % powder  Daily        07/08/23 9361    Ambulatory referral to Gastroenterology        07/08/23 9360             Note:  This document was prepared using Dragon voice recognition software and may include unintentional dictation errors.    Brenda Mylar, MD 07/08/23 [phone]

## 2023-07-08 NOTE — Telephone Encounter (Signed)
Colonoscopy referral received.  1st colonoscopy. Pt has been scheduled an office visit to see our PA Celso Amy due to change in bowel habits, and recent episode diverticulitis (ER visit today).  Appt scheduled 08/14/23.  Thanks,  [place], [place]

## 2023-08-12 ENCOUNTER — Other Ambulatory Visit: Payer: Self-pay

## 2023-08-13 NOTE — Progress Notes (Signed)
 Ellouise Console, PA-C [address]  [address]  [place], [place] [address]  Main: [phone]  Fax: [phone]   Gastroenterology Consultation  Referring Provider:     Valora Agent, MD Primary Care Physician:  Valora Agent, MD Primary Gastroenterologist:  Ellouise Console, PA-C  Reason for Consultation:     Change in bowel habits, diverticulitis        HPI:   Brenda Thornton is a 68 y.o. y/o female referred for consultation & management  by Valora Agent, MD.    Patient went to [facility] ED 07/08/2023 to evaluate lower abdominal pain and was diagnosed with uncomplicated sigmoid diverticulitis.  Labs showed normal white count 10.1, hemoglobin 14.0.  She was treated with Augmentin  for 10 days and her LLQ pain resolved.  She felt good for about 3 weeks.  In the past week or 2 she has had recurrent left lower quadrant pain worsening.  She denies rectal bleeding, diarrhea, constipation, fever, chills, nausea, or vomiting.  Has been on Metamucil since December.  07/07/2023 CT abdomen pelvis showed sigmoid diverticulitis with no abscess or perforation.  Bilateral adrenal adenomas.  Left-sided renal cyst.  Hysterectomy.  Patient reports having a colonoscopy 15 years ago, results are not available.  She is overdue for a repeat screening colonoscopy.  She reports having 1 previous episode of diverticulitis 10 years ago and none since.    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 Authorizing Provider  amLODipine  (NORVASC ) 5 MG tablet Take by mouth. 07/14/23  Yes [provider]  Vitamin D3 (VITAMIN D) 25 MCG tablet Take 1,000 Units by mouth daily.   Yes [provider]     Family History  Problem Relation Age of Onset   Breast cancer Paternal Aunt    Leukemia Mother    Lung cancer Father      Social History   Tobacco Use   Smoking status: Never   Smokeless tobacco: Never  Vaping Use   Vaping status: Never Used  Substance Use Topics   Alcohol use: Yes    Comment: occassional   Drug use: Never    Allergies as of 08/14/2023 - Review Complete 08/14/2023  Allergen Reaction Noted   Losartan Hives 05/27/2022   Mastisol adhesive [wound dressing adhesive] Rash 02/12/2022   Wound dressings Rash 02/12/2022    Review of Systems:    All systems reviewed and negative except where noted in HPI.   Physical Exam:  BP 119/77   Pulse 81   Temp 97.7 F (36.5 C)   Ht 5' 7 (1.702 m)   Wt 221 lb 9.6 oz (100.5 kg)   BMI 34.71 kg/m  No  LMP recorded. Patient has had a hysterectomy.  General:   Alert,  Well-developed, well-nourished, pleasant and cooperative in NAD Lungs:  Respirations even and unlabored.  Clear throughout to auscultation.   No wheezes, crackles, or rhonchi. No acute distress. Heart:  Regular rate and rhythm; no murmurs, clicks, rubs, or gallops. Abdomen:  Normal bowel sounds.  No bruits.  Soft, and non-distended without masses, hepatosplenomegaly or hernias noted.  No Tenderness.  No guarding or rebound tenderness.    Neurologic:  Alert and oriented x3;  grossly normal neurologically. Psych:  Alert and cooperative. Normal mood and affect.  Imaging Studies: No results found.  Assessment and Plan:   Brenda Thornton is a 69  y.o. y/o female has been referred for follow-up of sigmoid colon diverticulitis diagnosed on CT 07/08/2023.  No perforation or abscess.  LLQ pain resolved after taking Augmentin  for 10 days.  However, she has had recurrent LLQ pain in the past few weeks, getting worse.  I am concerned about recurrent sigmoid diverticulitis.  Moderate LLQ tenderness on exam today.  1.  Sigmoid colon diverticulitis 2.  Recurrent LLQ pain with tenderness on exam today  Plan: -CT abdomen pelvis with contrast -Rx ciprofloxacin  500 Mg twice daily x 10 days -Rx metronidazole  250 Mg 3 times daily x 10 days  Follow up in 4 weeks to assess response to treatment and decide about scheduling colonoscopy.  Ellouise Console, PA-C

## 2023-08-14 ENCOUNTER — Ambulatory Visit: Payer: No Typology Code available for payment source | Admitting: Physician Assistant

## 2023-08-14 ENCOUNTER — Encounter: Payer: Self-pay | Admitting: Physician Assistant

## 2023-08-14 VITALS — BP 119/77 | HR 81 | Temp 97.7°F | Ht 67.0 in | Wt 221.6 lb

## 2023-08-14 DIAGNOSIS — R1032 Left lower quadrant pain: Secondary | ICD-10-CM | POA: Diagnosis not present

## 2023-08-14 DIAGNOSIS — K5732 Diverticulitis of large intestine without perforation or abscess without bleeding: Secondary | ICD-10-CM

## 2023-08-14 DIAGNOSIS — Z8719 Personal history of other diseases of the digestive system: Secondary | ICD-10-CM | POA: Diagnosis not present

## 2023-08-14 MED ORDER — METRONIDAZOLE 250 MG PO TABS: 250.0000 mg | ORAL_TABLET | Freq: Three times a day (TID) | ORAL | 0 refills | Status: DC

## 2023-08-14 MED ORDER — CIPROFLOXACIN HCL 500 MG PO TABS: 500.0000 mg | ORAL_TABLET | Freq: Two times a day (BID) | ORAL | 0 refills | Status: DC

## 2023-08-14 NOTE — Patient Instructions (Addendum)
 CT abdomen and pelvis with contrast scheduled 08/22/23 arrive at 1:45 pm. Follow up 4 weeks.

## 2023-08-22 ENCOUNTER — Ambulatory Visit
Admission: RE | Admit: 2023-08-22 | Discharge: 2023-08-22 | Disposition: A | Discharge status: Home / Self Care | Payer: No Typology Code available for payment source | Source: Ambulatory Visit | Attending: Physician Assistant | Admitting: Physician Assistant

## 2023-08-22 DIAGNOSIS — D3502 Benign neoplasm of left adrenal gland: Secondary | ICD-10-CM | POA: Diagnosis not present

## 2023-08-22 DIAGNOSIS — R1032 Left lower quadrant pain: Secondary | ICD-10-CM | POA: Diagnosis not present

## 2023-08-22 DIAGNOSIS — K5732 Diverticulitis of large intestine without perforation or abscess without bleeding: Secondary | ICD-10-CM | POA: Diagnosis not present

## 2023-08-22 DIAGNOSIS — K5730 Diverticulosis of large intestine without perforation or abscess without bleeding: Secondary | ICD-10-CM | POA: Diagnosis not present

## 2023-08-22 DIAGNOSIS — K449 Diaphragmatic hernia without obstruction or gangrene: Secondary | ICD-10-CM | POA: Diagnosis not present

## 2023-08-22 DIAGNOSIS — D3501 Benign neoplasm of right adrenal gland: Secondary | ICD-10-CM | POA: Diagnosis not present

## 2023-08-22 MED ORDER — IOHEXOL 300 MG/ML  SOLN
100.0000 mL | Freq: Once | INTRAMUSCULAR | Status: AC | PRN
  Administered 2023-08-22: 100 mL via INTRAVENOUS

## 2023-08-25 ENCOUNTER — Telehealth: Payer: Self-pay

## 2023-08-25 NOTE — Telephone Encounter (Signed)
Patient notified of all results.  Call and notify patient abdominal pelvic CT with contrast shows:  1.  No evidence of diverticulitis.  There is diverticulosis but no current diverticulitis infection.  Previous infection has resolved.  2.  Moderate hiatal hernia which can cause acid reflux.  3.  Stable benign bilateral adrenal adenomas.  Not worrisome.  No follow-up imaging is recommended.  4.  Aortic atherosclerosis (plaque buildup in the aorta).  Recommend healthy diet and regular exercise.  Follow-up with PCP.  5.  No acute abnormality to explain abdominal pain.  Finish current antibiotics and keep follow-up appointment as scheduled.  Celso Amy, PA-C

## 2023-08-25 NOTE — Telephone Encounter (Signed)
Left message to call office---  Call and notify patient abdominal pelvic CT with contrast shows:  1.  No evidence of diverticulitis.  There is diverticulosis but no current diverticulitis infection.  Previous infection has resolved.  2.  Moderate hiatal hernia which can cause acid reflux.  3.  Stable benign bilateral adrenal adenomas.  Not worrisome.  No follow-up imaging is recommended.  4.  Aortic atherosclerosis (plaque buildup in the aorta).  Recommend healthy diet and regular exercise.  Follow-up with PCP.  5.  No acute abnormality to explain abdominal pain.  Finish current antibiotics and keep follow-up appointment as scheduled.  Celso Amy, PA-C

## 2023-08-25 NOTE — Progress Notes (Signed)
Call and notify patient abdominal pelvic CT with contrast shows: 1.  No evidence of diverticulitis.  There is diverticulosis but no current diverticulitis infection.  Previous infection has resolved. 2.  Moderate hiatal hernia which can cause acid reflux. 3.  Stable benign bilateral adrenal adenomas.  Not worrisome.  No follow-up imaging is recommended. 4.  Aortic atherosclerosis (plaque buildup in the aorta).  Recommend healthy diet and regular exercise.  Follow-up with PCP. 5.  No acute abnormality to explain abdominal pain. Finish current antibiotics and keep follow-up appointment as scheduled. Celso Amy, PA-C

## 2023-08-26 ENCOUNTER — Telehealth: Payer: Self-pay

## 2023-08-26 NOTE — Telephone Encounter (Signed)
Patient had a question regarding her CT scan- questions answered.

## 2023-09-09 ENCOUNTER — Other Ambulatory Visit: Payer: Self-pay

## 2023-09-09 DIAGNOSIS — I10 Essential (primary) hypertension: Secondary | ICD-10-CM | POA: Diagnosis not present

## 2023-09-10 NOTE — Progress Notes (Signed)
 Celso Amy, PA-C [address]  [address]  [place], [place] [address]  Main: [phone]  Fax: [phone]   Primary Care Physician: Jerl Mina, MD  Primary Gastroenterologist:  Celso Amy, PA-C / Dr. Wyline Mood    CC: F/U LLQ Pain  HPI: Brenda Thornton is a 68 y.o. female returns for follow-up of LLQ pain.  Treated for (second episode) of uncomplicated sigmoid diverticulitis 06/2023 with Augmentin for 10 days.  Has been on Metamucil since then.  First episode of diverticulitis occurred 10 years ago.  08/22/2023 CT abdomen pelvis with contrast showed resolution of previous diverticulitis.  Moderate hiatal hernia.  Small bilateral benign adrenal adenomas.  No acute abnormality.  07/07/2023 CT abdomen pelvis with contrast showed sigmoid diverticulitis with no perforation or abscess.  She reports having colonoscopy 15 years ago, results unavailable.  She is overdue for repeat screening colonoscopy.  Currently she is not having any GI symptoms.  She denies abdominal pain, constipation, rectal bleeding, heartburn, dysphagia, acid reflux, or any other GI symptoms.  Current Outpatient Medications  Medication Sig Dispense Refill   amLODipine (NORVASC) 5 MG tablet Take by mouth.     METAMUCIL 4 IN 1 FIBER 51.7 % PACK      Vitamin D3 (VITAMIN D) 25 MCG tablet Take 1,000 Units by mouth daily.     No current facility-administered medications for this visit.    Allergies as of 09/11/2023 - Review Complete 09/11/2023  Allergen Reaction Noted   Losartan Hives 05/27/2022   Mastisol adhesive [wound dressing adhesive] Rash 02/12/2022   Wound dressings Rash 02/12/2022    Past Medical History:  Diagnosis Date   Acute hip pain 04/13/2014   Arthritis    Complication of anesthesia    hard to wake up   History of hip replacement, total, left 2011   History of kidney stones    h/o   Hypertension    Osteoarthritis of both hips 04/13/2014   Otalgia of right ear 06/12/2020    PONV (postoperative nausea and vomiting)    naueseated only    Past Surgical History:  Procedure Laterality Date   ABDOMINAL HYSTERECTOMY     JOINT REPLACEMENT Left 2015   Hip   TOTAL HIP ARTHROPLASTY Right 03/23/2020   Procedure: TOTAL HIP ARTHROPLASTY ANTERIOR APPROACH;  Surgeon: Kennedy Bucker, MD;  Location: [facility] ORS;  Service: Orthopedics;  Laterality: Right;    Review of Systems:    All systems reviewed and negative except where noted in HPI.   Physical Examination:   BP 130/82   Pulse 84   Temp 98.1 F (36.7 C)   Ht 5\' 7"  (1.702 m)   Wt 221 lb 9.6 oz (100.5 kg)   BMI 34.71 kg/m   General: Well-nourished, well-developed in no acute distress.  Lungs: Clear to auscultation bilaterally. Non-labored. Heart: Regular rate and rhythm, no murmurs rubs or gallops.  Abdomen: Bowel sounds are normal; Abdomen is Soft; No hepatosplenomegaly, masses or hernias;  No Abdominal Tenderness; No guarding or rebound tenderness. Neuro: Alert and oriented x 3.  Grossly intact.  Psych: Alert and cooperative, normal mood and affect.   Imaging Studies: CT ABDOMEN PELVIS W CONTRAST Result Date: 08/25/2023 CLINICAL DATA:  Diverticulitis, left lower quadrant abdominal pain. EXAM: CT ABDOMEN AND PELVIS WITH CONTRAST TECHNIQUE: Multidetector CT imaging of the abdomen and pelvis was performed using the standard protocol following bolus administration of intravenous contrast. RADIATION DOSE REDUCTION: This exam was performed according to the departmental dose-optimization program which  includes automated exposure control, adjustment of the mA and/or kV according to patient size and/or use of iterative reconstruction technique. CONTRAST:  OMNIPAQUE IOHEXOL 300 MG/ML  SOLN COMPARISON:  CT July 07, 2023 FINDINGS: Lower chest: Moderate-sized hiatal hernia. Hepatobiliary: Cyst in the right lobe of the liver. Pharyngeal cap on the gallbladder. No biliary ductal dilation. Pancreas: No pancreatic ductal  dilation or evidence of acute inflammation. Spleen: No splenomegaly. Adrenals/Urinary Tract: 3 mm right and 1.6 cm left adrenal adenomas are stable from prior and considered benign requiring no independent imaging follow-up. No hydronephrosis. Left renal cysts are considered benign and requiring no independent imaging follow-up. Kidneys demonstrate symmetric enhancement. Urinary bladder is minimally distended and obscured by streak artifact from bilateral hip arthroplasties. Stomach/Bowel: Radiopaque enteric contrast material traverses the splenic flexure. Moderate-sized hiatal hernia. No evidence of acute bowel inflammation. No evidence of bowel obstruction. Sigmoid colonic diverticulosis. Vascular/Lymphatic: Aortic atherosclerosis. Circumaortic left renal vein. No enlarged abdominal or pelvic lymph nodes. Reproductive: Obscured by streak artifact from bilateral hip arthroplasties. Other: No significant abdominopelvic free fluid. Musculoskeletal: Bilateral hip arthroplasties. IMPRESSION: 1. No acute abnormality in the abdomen or pelvis. 2. Sigmoid colonic diverticulosis without evidence of acute diverticulitis. 3. Moderate-sized hiatal hernia. 4. Stable bilateral adrenal adenomas, considered benign and requiring no independent imaging follow-up. 5.  Aortic Atherosclerosis (ICD10-I70.0). Electronically Signed   By: Maudry Mayhew M.D.   On: 08/25/2023 07:51    Assessment and Plan:   Brenda Thornton is a 68 y.o. y/o female returns for follow-up of:  1.  History of uncomplicated sigmoid diverticulitis - currently resolved.  Continue Metamucil or other fiber supplement daily.  2.  Colon cancer screening; last colonoscopy 15 years ago.  Scheduling Colonoscopy I discussed risks of colonoscopy with patient to include risk of bleeding, colon perforation, and risk of sedation.  Patient expressed understanding and agrees to proceed with colonoscopy.   Celso Amy, PA-C  Follow up as needed based on  colonoscopy results and GI symptoms.

## 2023-09-11 ENCOUNTER — Ambulatory Visit: Payer: No Typology Code available for payment source | Admitting: Physician Assistant

## 2023-09-11 ENCOUNTER — Encounter: Payer: Self-pay | Admitting: Physician Assistant

## 2023-09-11 VITALS — BP 130/82 | HR 84 | Temp 98.1°F | Ht 67.0 in | Wt 221.6 lb

## 2023-09-11 DIAGNOSIS — Z1211 Encounter for screening for malignant neoplasm of colon: Secondary | ICD-10-CM

## 2023-09-11 DIAGNOSIS — Z09 Encounter for follow-up examination after completed treatment for conditions other than malignant neoplasm: Secondary | ICD-10-CM | POA: Diagnosis not present

## 2023-09-11 DIAGNOSIS — Z8719 Personal history of other diseases of the digestive system: Secondary | ICD-10-CM

## 2023-09-11 MED ORDER — PEG 3350-KCL-NA BICARB-NACL 420 G PO SOLR
4000.0000 mL | Freq: Once | ORAL | 0 refills | Status: AC
Start: 2023-09-11 — End: 2023-09-11

## 2023-09-16 DIAGNOSIS — I10 Essential (primary) hypertension: Secondary | ICD-10-CM | POA: Diagnosis not present

## 2023-09-16 DIAGNOSIS — Z0000 Encounter for general adult medical examination without abnormal findings: Secondary | ICD-10-CM | POA: Diagnosis not present

## 2023-09-16 DIAGNOSIS — E785 Hyperlipidemia, unspecified: Secondary | ICD-10-CM | POA: Diagnosis not present

## 2023-09-18 DIAGNOSIS — E669 Obesity, unspecified: Secondary | ICD-10-CM | POA: Diagnosis not present

## 2023-09-18 DIAGNOSIS — I10 Essential (primary) hypertension: Secondary | ICD-10-CM | POA: Diagnosis not present

## 2023-09-18 DIAGNOSIS — E785 Hyperlipidemia, unspecified: Secondary | ICD-10-CM | POA: Diagnosis not present

## 2023-09-18 DIAGNOSIS — Z6834 Body mass index (BMI) 34.0-34.9, adult: Secondary | ICD-10-CM | POA: Diagnosis not present

## 2023-09-18 DIAGNOSIS — Z008 Encounter for other general examination: Secondary | ICD-10-CM | POA: Diagnosis not present

## 2023-10-01 ENCOUNTER — Telehealth: Payer: Self-pay | Admitting: Gastroenterology

## 2023-10-01 NOTE — Telephone Encounter (Signed)
 Pt requesting call back to see if she could change to a different

## 2023-10-02 ENCOUNTER — Telehealth: Payer: Self-pay

## 2023-10-02 ENCOUNTER — Other Ambulatory Visit: Payer: Self-pay

## 2023-10-02 MED ORDER — CLENPIQ 10-3.5-12 MG-GM -GM/160ML PO SOLN: ORAL | 0 refills | Status: AC

## 2023-10-02 NOTE — Telephone Encounter (Signed)
 Pt called to discuss arternative prep

## 2023-10-02 NOTE — Telephone Encounter (Signed)
error 

## 2023-10-02 NOTE — Telephone Encounter (Signed)
Called patient back and left her a voicemail to call me back.

## 2023-10-03 NOTE — Telephone Encounter (Signed)
 Sutab had been sent to her pharmacy per patient's request.

## 2023-10-07 ENCOUNTER — Encounter: Payer: Self-pay | Admitting: Gastroenterology

## 2023-10-08 ENCOUNTER — Encounter: Payer: Self-pay | Admitting: Gastroenterology

## 2023-10-08 ENCOUNTER — Ambulatory Visit
Admission: RE | Admit: 2023-10-08 | Discharge: 2023-10-08 | Disposition: A | Discharge status: Home / Self Care | Attending: Gastroenterology | Admitting: Gastroenterology

## 2023-10-08 ENCOUNTER — Ambulatory Visit: Admitting: Anesthesiology

## 2023-10-08 ENCOUNTER — Encounter
Admission: RE | Disposition: A | Discharge status: Home / Self Care | Payer: Self-pay | Source: Home / Self Care | Attending: Gastroenterology

## 2023-10-08 ENCOUNTER — Other Ambulatory Visit: Payer: Self-pay

## 2023-10-08 DIAGNOSIS — I10 Essential (primary) hypertension: Secondary | ICD-10-CM | POA: Insufficient documentation

## 2023-10-08 DIAGNOSIS — D490 Neoplasm of unspecified behavior of digestive system: Secondary | ICD-10-CM | POA: Diagnosis not present

## 2023-10-08 DIAGNOSIS — Z1211 Encounter for screening for malignant neoplasm of colon: Secondary | ICD-10-CM | POA: Diagnosis not present

## 2023-10-08 DIAGNOSIS — K5732 Diverticulitis of large intestine without perforation or abscess without bleeding: Secondary | ICD-10-CM | POA: Insufficient documentation

## 2023-10-08 DIAGNOSIS — K5730 Diverticulosis of large intestine without perforation or abscess without bleeding: Secondary | ICD-10-CM | POA: Diagnosis not present

## 2023-10-08 DIAGNOSIS — Z79899 Other long term (current) drug therapy: Secondary | ICD-10-CM | POA: Diagnosis not present

## 2023-10-08 HISTORY — PX: COLONOSCOPY: SHX5424

## 2023-10-08 SURGERY — COLONOSCOPY
Anesthesia: General

## 2023-10-08 MED ORDER — SODIUM CHLORIDE 0.9 % IV SOLN: INTRAVENOUS | Status: DC

## 2023-10-08 MED ORDER — PROPOFOL 10 MG/ML IV BOLUS
INTRAVENOUS | Status: AC
  Filled 2023-10-08: qty 20

## 2023-10-08 MED ORDER — ONDANSETRON HCL 4 MG/2ML IJ SOLN
INTRAMUSCULAR | Status: DC | PRN
  Administered 2023-10-08: 4 mg via INTRAVENOUS

## 2023-10-08 MED ORDER — PROPOFOL 10 MG/ML IV BOLUS
INTRAVENOUS | Status: AC
  Filled 2023-10-08: qty 40

## 2023-10-08 MED ORDER — ONDANSETRON HCL 4 MG/2ML IJ SOLN
INTRAMUSCULAR | Status: AC
  Filled 2023-10-08: qty 2

## 2023-10-08 MED ORDER — PROPOFOL 500 MG/50ML IV EMUL
INTRAVENOUS | Status: DC | PRN
  Administered 2023-10-08: 100 ug/kg/min via INTRAVENOUS

## 2023-10-08 MED ORDER — PROPOFOL 10 MG/ML IV BOLUS
INTRAVENOUS | Status: DC | PRN
  Administered 2023-10-08: 20 mg via INTRAVENOUS
  Administered 2023-10-08: 80 mg via INTRAVENOUS
  Administered 2023-10-08: 50 mg via INTRAVENOUS

## 2023-10-08 NOTE — Anesthesia Preprocedure Evaluation (Signed)
 Anesthesia Evaluation  Patient identified by MRN, date of birth, ID band Patient awake    Reviewed: Allergy & Precautions, NPO status , Patient's Chart, lab work & pertinent test results  History of Anesthesia Complications (+) PONV and history of anesthetic complications  Airway Mallampati: II  TM Distance: >3 FB Neck ROM: Full    Dental  (+) Partial Upper   Pulmonary neg pulmonary ROS, neg sleep apnea, neg COPD, Patient abstained from smoking.Not current smoker   Pulmonary exam normal breath sounds clear to auscultation       Cardiovascular Exercise Tolerance: Good METShypertension, Pt. on medications (-) CAD and (-) Past MI (-) dysrhythmias  Rhythm:Regular Rate:Normal - Systolic murmurs    Neuro/Psych negative neurological ROS  negative psych ROS   GI/Hepatic ,neg GERD  ,,(+)     (-) substance abuse    Endo/Other  neg diabetes    Renal/GU negative Renal ROS     Musculoskeletal   Abdominal   Peds  Hematology   Anesthesia Other Findings Past Medical History: 04/13/2014: Acute hip pain No date: Arthritis No date: Complication of anesthesia     Comment:  hard to wake up 2011: History of hip replacement, total, left No date: History of kidney stones     Comment:  h/o No date: Hypertension 04/13/2014: Osteoarthritis of both hips 06/12/2020: Otalgia of right ear No date: PONV (postoperative nausea and vomiting)     Comment:  naueseated only  Reproductive/Obstetrics                             Anesthesia Physical Anesthesia Plan  ASA: 2  Anesthesia Plan: General   Post-op Pain Management: Minimal or no pain anticipated   Induction: Intravenous  PONV Risk Score and Plan: 4 or greater and Propofol infusion, TIVA and Ondansetron  Airway Management Planned: Nasal Cannula  Additional Equipment: None  Intra-op Plan:   Post-operative Plan:   Informed Consent: I have  reviewed the patients History and Physical, chart, labs and discussed the procedure including the risks, benefits and alternatives for the proposed anesthesia with the patient or authorized representative who has indicated his/her understanding and acceptance.     Dental advisory given  Plan Discussed with: CRNA and Surgeon  Anesthesia Plan Comments: (Discussed risks of anesthesia with patient, including possibility of difficulty with spontaneous ventilation under anesthesia necessitating airway intervention, PONV, and rare risks such as cardiac or respiratory or neurological events, and allergic reactions. Discussed the role of CRNA in patient's perioperative care. Patient understands.)       Anesthesia Quick Evaluation

## 2023-10-08 NOTE — H&P (Signed)
 Wyline Mood, MD [address], [place], [place], [address] [address], [place], [place], [address] Phone: [phone]  Fax: [phone]  Primary Care Physician:  Jerl Mina, MD   Pre-Procedure History & Physical: HPI:  Brenda Thornton is a 68 y.o. female is here for an colonoscopy.   Past Medical History:  Diagnosis Date   Acute hip pain 04/13/2014   Arthritis    Complication of anesthesia    hard to wake up   History of hip replacement, total, left 2011   History of kidney stones    h/o   Hypertension    Osteoarthritis of both hips 04/13/2014   Otalgia of right ear 06/12/2020   PONV (postoperative nausea and vomiting)    naueseated only    Past Surgical History:  Procedure Laterality Date   ABDOMINAL HYSTERECTOMY     JOINT REPLACEMENT Left 2015   Hip   TOTAL HIP ARTHROPLASTY Right 03/23/2020   Procedure: TOTAL HIP ARTHROPLASTY ANTERIOR APPROACH;  Surgeon: Kennedy Bucker, MD;  Location: [facility] ORS;  Service: Orthopedics;  Laterality: Right;    Prior to Admission medications   Medication Sig Start Date End Date Taking? Authorizing Provider  amLODipine (NORVASC) 5 MG tablet Take by mouth. 07/14/23  Yes [provider]  Vitamin D3 (VITAMIN D) 25 MCG tablet Take 1,000 Units by mouth daily.   Yes [provider]  METAMUCIL 4 IN 1 FIBER 51.7 % PACK  07/08/23   [provider]  Sod Picosulfate-Mag Ox-Cit Acd (CLENPIQ) 10-3.5-12 MG-GM -GM/160ML SOLN Take 1 bottle at 5 PM followed by five 8 oz cups of water and repeat 5 hours before procedure. 10/02/23   Wyline Mood, MD    Allergies as of 09/11/2023 - Review Complete 09/11/2023  Allergen Reaction Noted   Losartan Hives 05/27/2022   Mastisol adhesive [wound dressing adhesive] Rash 02/12/2022   Wound dressings Rash 02/12/2022    Family History  Problem Relation Age of Onset   Breast cancer Paternal Aunt    Leukemia Mother    Lung cancer Father     Social History    Socioeconomic History   Marital status: Divorced    Spouse name: Not on file   Number of children: Not on file   Years of education: Not on file   Highest education level: Not on file  Occupational History   Not on file  Tobacco Use   Smoking status: Never   Smokeless tobacco: Never  Vaping Use   Vaping status: Never Used  Substance and Sexual Activity   Alcohol use: Yes    Comment: occassional   Drug use: Never   Sexual activity: Not on file  Other Topics Concern   Not on file  Social History Narrative   Not on file   Social Drivers of Health   Financial Resource Strain: Low Risk  (09/16/2023)   Received from [facility]   Overall Financial Resource Strain (CARDIA)    Difficulty of Paying Living Expenses: Not hard at all  Food Insecurity: No Food Insecurity (09/16/2023)   Received from [facility]   Hunger Vital Sign    Worried About Running Out of Food in the Last Year: Never true    Ran Out of Food in the Last Year: Never true  Transportation Needs: No Transportation Needs (09/16/2023)   Received from [facility] - Transportation    In the past 12 months,  has lack of transportation kept you from medical appointments or from getting medications?: No    Lack of Transportation (Non-Medical): No  Physical Activity: Not on file  Stress: Not on file  Social Connections: Not on file  Intimate Partner Violence: Not on file    Review of Systems: See HPI, otherwise negative ROS  Physical Exam: There were no vitals taken for this visit. General:   Alert,  pleasant and cooperative in NAD Head:  Normocephalic and atraumatic. Neck:  Supple; no masses or thyromegaly. Lungs:  Clear throughout to auscultation, normal respiratory effort.    Heart:  +S1, +S2, Regular rate and rhythm, No edema. Abdomen:  Soft, nontender and nondistended. Normal bowel sounds, without guarding, and without rebound.   Neurologic:   Alert and  oriented x4;  grossly normal neurologically.  Impression/Plan: Brenda Thornton is here for an colonoscopy to be performed for history of diverticulitis.  Risks, benefits, limitations, and alternatives regarding  colonoscopy have been reviewed with the patient.  Questions have been answered.  All parties agreeable.   Wyline Mood, MD  10/08/2023, 10:53 AM

## 2023-10-08 NOTE — Transfer of Care (Signed)
 Immediate Anesthesia Transfer of Care Note  Patient: Brenda Thornton  Procedure(s) Performed: COLONOSCOPY  Patient Location: PACU  Anesthesia Type:MAC  Level of Consciousness: awake  Airway & Oxygen Therapy: Patient Spontanous Breathing  Post-op Assessment: Report given to RN and Post -op Vital signs reviewed and stable  Post vital signs: Reviewed and stable  Last Vitals:  Vitals Value Taken Time  BP 132/84 10/08/23 1208  Temp    Pulse 103 10/08/23 1208  Resp 16 10/08/23 1208  SpO2 99 % 10/08/23 1208  Vitals shown include unfiled device data.  Last Pain:  Vitals:   10/08/23 1208  TempSrc:   PainSc: 0-No pain         Complications: No notable events documented.

## 2023-10-08 NOTE — Op Note (Signed)
 [facility] Patient Name: Brenda Thornton Procedure Date: 10/08/2023 11:18 AM MRN: [id number] Account #: [id number] Date of Birth: Jan 26, 1956 Admit Type: Outpatient Age: 68 Room: [facility] ENDO ROOM 3 Gender: Female Note Status: Finalized Instrument Name: Peds Colonoscope [id number] Procedure:             Colonoscopy Indications:           Follow-up of diverticulitis Providers:             Wyline Mood MD, MD Referring MD:          Rhona Leavens. Burnett Sheng, MD (Referring MD) Medicines:             Monitored Anesthesia Care Complications:         No immediate complications. Procedure:             Pre-Anesthesia Assessment:                        - Prior to the procedure, a History and Physical was                         performed, and patient medications, allergies and                         sensitivities were reviewed. The patient's tolerance                         of previous anesthesia was reviewed.                        - The risks and benefits of the procedure and the                         sedation options and risks were discussed with the                         patient. All questions were answered and informed                         consent was obtained.                        - ASA Grade Assessment: II - A patient with mild                         systemic disease.                        After obtaining informed consent, the colonoscope was                         passed under direct vision. Throughout the procedure,                         the patient's blood pressure, pulse, and oxygen                         saturations were monitored continuously. The                         Colonoscope was introduced  through the anus and                         advanced to the the cecum, identified by the                         appendiceal orifice. The colonoscopy was performed                         with ease. The patient tolerated the procedure well.                          The quality of the bowel preparation was excellent.                         The ileocecal valve, appendiceal orifice, and rectum                         were photographed. Findings:      The perianal and digital rectal examinations were normal.      Multiple medium-mouthed diverticula were found in the sigmoid colon.      A 7 mm polypoid lesion was found in the sigmoid colon. The lesion was       semi-sessile. No bleeding was present. didnt attempt polypectomy as it       appeared inflammed and could have been an inverted diverticulum - 15 cm       from anus in proximity to diverticuli Impression:            - Diverticulosis in the sigmoid colon.                        - Likely benign polypoid lesion in the sigmoid colon.                        - No specimens collected. Recommendation:        - Discharge patient to home (with escort).                        - Resume previous diet.                        - Continue present medications.                        - Perform a flexible sigmoidoscopy to check healing in                         4 months.                        - to evaluate polyp vs diverticuli at 15 cm mark from                         anus Procedure Code(s):     --- Professional ---                        [phone], Colonoscopy, flexible; diagnostic, including  collection of specimen(s) by brushing or washing, when                         performed (separate procedure) Diagnosis Code(s):     --- Professional ---                        D49.0, Neoplasm of unspecified behavior of digestive                         system                        K57.30, Diverticulosis of large intestine without                         perforation or abscess without bleeding                        K57.32, Diverticulitis of large intestine without                         perforation or abscess without bleeding CPT copyright 2022 American Medical Association. All  rights reserved. The codes documented in this report are preliminary and upon coder review may  be revised to meet current compliance requirements. Wyline Mood, MD Wyline Mood MD, MD 10/08/2023 12:07:53 PM This report has been signed electronically. Number of Addenda: 0 Note Initiated On: 10/08/2023 11:18 AM Scope Withdrawal Time: 0 hours 9 minutes 33 seconds  Total Procedure Duration: 0 hours 11 minutes 36 seconds  Estimated Blood Loss:  Estimated blood loss: none.      [facility]

## 2023-10-08 NOTE — Anesthesia Postprocedure Evaluation (Signed)
 Anesthesia Post Note  Patient: Brenda Thornton  Procedure(s) Performed: COLONOSCOPY  Patient location during evaluation: Endoscopy Anesthesia Type: General Level of consciousness: awake and alert Pain management: pain level controlled Vital Signs Assessment: post-procedure vital signs reviewed and stable Respiratory status: spontaneous breathing, nonlabored ventilation, respiratory function stable and patient connected to nasal cannula oxygen Cardiovascular status: blood pressure returned to baseline and stable Postop Assessment: no apparent nausea or vomiting Anesthetic complications: no   No notable events documented.   Last Vitals:  Vitals:   10/08/23 1208 10/08/23 1219  BP: 132/84 125/78  Pulse: 96 97  Resp: 16 16  Temp:    SpO2: 99% 100%    Last Pain:  Vitals:   10/08/23 1219  TempSrc:   PainSc: 0-No pain                 Corinda Gubler

## 2023-10-09 ENCOUNTER — Encounter: Payer: Self-pay | Admitting: Gastroenterology

## 2023-10-13 ENCOUNTER — Telehealth: Payer: Self-pay

## 2023-10-13 NOTE — Telephone Encounter (Signed)
 Patient left a voicemail on the main line and states she is calling to schedule her repeat colonoscopy with Dr. Tobi Bastos.

## 2023-10-14 NOTE — Telephone Encounter (Signed)
 Called patient to let er know the following per Dr. Johnney Killian colonoscopy notes: Perform a flexible sigmoidoscopy to check healing in 4 months.- to evaluate polyp vs diverticuli at 15 cm mark from anus. I let the patient know that I had placed her in our recall list to remind her that she will be needing a flexible sigmoidoscopy in 4 months. Patient understood and had n further questions.

## 2024-01-15 ENCOUNTER — Telehealth: Payer: Self-pay

## 2024-01-15 NOTE — Telephone Encounter (Signed)
 Dr Therisa pt who does not wish to transfer to [facility]... Pt needs to schedule a flex sig to check healing and evaluate polyp vs diverticuli. Will it be okay to schedule pt with you?

## 2024-01-26 ENCOUNTER — Other Ambulatory Visit: Payer: Self-pay

## 2024-01-26 DIAGNOSIS — Z860100 Personal history of colon polyps, unspecified: Secondary | ICD-10-CM

## 2024-01-26 NOTE — Telephone Encounter (Signed)
 Pt has been scheduled for flex sigmoidoscopy and instructions gone over mailed to pt, she is not signed up for Mychart

## 2024-02-23 ENCOUNTER — Encounter
Admission: RE | Disposition: A | Discharge status: Home / Self Care | Payer: Self-pay | Source: Home / Self Care | Attending: Gastroenterology

## 2024-02-23 ENCOUNTER — Other Ambulatory Visit: Payer: Self-pay

## 2024-02-23 ENCOUNTER — Encounter: Payer: Self-pay | Admitting: Gastroenterology

## 2024-02-23 ENCOUNTER — Ambulatory Visit
Admission: RE | Admit: 2024-02-23 | Discharge: 2024-02-23 | Disposition: A | Discharge status: Home / Self Care | Attending: Gastroenterology | Admitting: Gastroenterology

## 2024-02-23 ENCOUNTER — Ambulatory Visit: Admitting: Certified Registered"

## 2024-02-23 DIAGNOSIS — I10 Essential (primary) hypertension: Secondary | ICD-10-CM | POA: Diagnosis not present

## 2024-02-23 DIAGNOSIS — K635 Polyp of colon: Secondary | ICD-10-CM | POA: Diagnosis present

## 2024-02-23 DIAGNOSIS — K640 First degree hemorrhoids: Secondary | ICD-10-CM | POA: Insufficient documentation

## 2024-02-23 DIAGNOSIS — D374 Neoplasm of uncertain behavior of colon: Secondary | ICD-10-CM | POA: Diagnosis not present

## 2024-02-23 DIAGNOSIS — K5730 Diverticulosis of large intestine without perforation or abscess without bleeding: Secondary | ICD-10-CM | POA: Diagnosis not present

## 2024-02-23 DIAGNOSIS — Z860100 Personal history of colon polyps, unspecified: Secondary | ICD-10-CM

## 2024-02-23 HISTORY — PX: FLEXIBLE SIGMOIDOSCOPY: SHX5431

## 2024-02-23 SURGERY — SIGMOIDOSCOPY, FLEXIBLE
Anesthesia: General

## 2024-02-23 MED ORDER — SODIUM CHLORIDE 0.9 % IV SOLN
INTRAVENOUS | Status: DC
  Administered 2024-02-23: 500 mL via INTRAVENOUS

## 2024-02-23 MED ORDER — PROPOFOL 10 MG/ML IV BOLUS
INTRAVENOUS | Status: DC | PRN
  Administered 2024-02-23: 100 ug/kg/min via INTRAVENOUS
  Administered 2024-02-23: 60 mg via INTRAVENOUS

## 2024-02-23 MED ORDER — LIDOCAINE HCL (CARDIAC) PF 100 MG/5ML IV SOSY
PREFILLED_SYRINGE | INTRAVENOUS | Status: DC | PRN
  Administered 2024-02-23: 50 mg via INTRAVENOUS

## 2024-02-23 NOTE — H&P (Signed)
 Rogelia Copping, MD [facility] [address], [address] [place], [place] [address] Phone:[phone] Fax : [phone]  Primary Care Physician:  Valora Agent, MD Primary Gastroenterologist:  Dr. Copping  Pre-Procedure History & Physical: HPI:  Brenda Thornton is a 68 y.o. female is here for an flexible sigmoidoscopy.   Past Medical History:  Diagnosis Date   Acute hip pain 04/13/2014   Arthritis    Complication of anesthesia    hard to wake up   History of hip replacement, total, left 2011   History of kidney stones    h/o   Hypertension    Osteoarthritis of both hips 04/13/2014   Otalgia of right ear 06/12/2020   PONV (postoperative nausea and vomiting)    naueseated only    Past Surgical History:  Procedure Laterality Date   ABDOMINAL HYSTERECTOMY     COLONOSCOPY N/A 10/08/2023   Procedure: COLONOSCOPY;  Surgeon: Therisa Bi, MD;  Location: [facility] ENDOSCOPY;  Service: Gastroenterology;  Laterality: N/A;   JOINT REPLACEMENT Left 2015   Hip   TOTAL HIP ARTHROPLASTY Right 03/23/2020   Procedure: TOTAL HIP ARTHROPLASTY ANTERIOR APPROACH;  Surgeon: Kathlynn Sharper, MD;  Location: [facility] ORS;  Service: Orthopedics;  Laterality: Right;    Prior to Admission medications   Medication Sig Start Date End Date Taking? Authorizing Provider  amLODipine  (NORVASC ) 5 MG tablet Take by mouth. 07/14/23  Yes [provider]  Vitamin D3 (VITAMIN D) 25 MCG tablet Take 1,000 Units by mouth daily.   Yes [provider]  METAMUCIL 4 IN 1 FIBER 51.7 % PACK  07/08/23   [provider]  Sod Picosulfate-Mag Ox-Cit Acd (CLENPIQ ) 10-3.5-12 MG-GM -GM/160ML SOLN Take 1 bottle at 5 PM followed by five 8 oz cups of water and repeat 5 hours before procedure. 10/02/23   Therisa Bi, MD    Allergies as of 01/26/2024 - Review Complete 10/08/2023  Allergen Reaction Noted   Losartan Hives 05/27/2022   Mastisol adhesive [wound dressing adhesive] Rash 02/12/2022   Wound dressings Rash 02/12/2022     Family History  Problem Relation Age of Onset   Breast cancer Paternal Aunt    Leukemia Mother    Lung cancer Father     Social History   Socioeconomic History   Marital status: Divorced    Spouse name: Not on file   Number of children: Not on file   Years of education: Not on file   Highest education level: Not on file  Occupational History   Not on file  Tobacco Use   Smoking status: Never   Smokeless tobacco: Never  Vaping Use   Vaping status: Never Used  Substance and Sexual Activity   Alcohol use: Yes    Comment: occassional- once a month   Drug use: Never   Sexual activity: Not on file  Other Topics Concern   Not on file  Social History Narrative   Not on file   Social Drivers of Health   Financial Resource Strain: Low Risk  (09/16/2023)   Received from [facility]   Overall Financial Resource Strain (CARDIA)    Difficulty of Paying Living Expenses: Not hard at all  Food Insecurity: No Food Insecurity (09/16/2023)   Received from [facility]   Hunger Vital Sign    Within the past 12 months, you worried that your food would run out before you got the money to buy more.: Never true    Within the past 12 months, the  food you bought just didn't last and you didn't have money to get more.: Never true  Transportation Needs: No Transportation Needs (09/16/2023)   Received from [facility] - Transportation    In the past 12 months, has lack of transportation kept you from medical appointments or from getting medications?: No    Lack of Transportation (Non-Medical): No  Physical Activity: Not on file  Stress: Not on file  Social Connections: Not on file  Intimate Partner Violence: Not on file    Review of Systems: See HPI, otherwise negative ROS  Physical Exam: BP (!) 140/75   Pulse 89   Temp 97.7 F (36.5 C) (Tympanic)   Resp 13   Ht 5' 5 (1.651 m)   Wt 100.4 kg   SpO2 98%   BMI 36.84  kg/m  General:   Alert,  pleasant and cooperative in NAD Head:  Normocephalic and atraumatic. Neck:  Supple; no masses or thyromegaly. Lungs:  Clear throughout to auscultation.    Heart:  Regular rate and rhythm. Abdomen:  Soft, nontender and nondistended. Normal bowel sounds, without guarding, and without rebound.   Neurologic:  Alert and  oriented x4;  grossly normal neurologically.  Impression/Plan: Brenda Thornton is here for an flexible sigmoidoscopy to be performed for follow up of sigmoid polyp  Risks, benefits, limitations, and alternatives regarding  flexible sigmoidoscopy have been reviewed with the patient.  Questions have been answered.  All parties agreeable.   Rogelia Copping, MD  02/23/2024, 9:01 AM

## 2024-02-23 NOTE — Op Note (Signed)
 [facility] Gastroenterology Patient Name: Brenda Thornton Procedure Date: 02/23/2024 8:52 AM MRN: [id number] Account #: [id number] Date of Birth: 1956-05-18 Admit Type: Outpatient Age: 68 Room: [facility] ENDO ROOM 4 Gender: Female Note Status: Finalized Instrument Name: Barnie GI Scope [id number] Procedure:             Flexible Sigmoidoscopy Indications:           Colon polyps of uncertain behavior Providers:             Rogelia Copping MD, MD Referring MD:          Lynwood FALCON. Valora, MD (Referring MD) Medicines:             Propofol  per Anesthesia Complications:         No immediate complications. Procedure:             Pre-Anesthesia Assessment:                        - Prior to the procedure, a History and Physical was                         performed, and patient medications and allergies were                         reviewed. The patient's tolerance of previous                         anesthesia was also reviewed. The risks and benefits                         of the procedure and the sedation options and risks                         were discussed with the patient. All questions were                         answered, and informed consent was obtained. Prior                         Anticoagulants: The patient has taken no anticoagulant                         or antiplatelet agents. ASA Grade Assessment: II - A                         patient with mild systemic disease. After reviewing                         the risks and benefits, the patient was deemed in                         satisfactory condition to undergo the procedure.                        After obtaining informed consent, the scope was passed                         under direct vision. The Endoscope was introduced  through the anus and advanced to the the descending                         colon. The flexible sigmoidoscopy was accomplished                         without  difficulty. The patient tolerated the                         procedure well. The quality of the bowel preparation                         was good. Findings:      The perianal and digital rectal examinations were normal.      A few small-mouthed diverticula were found in the sigmoid colon.      Non-bleeding internal hemorrhoids were found during retroflexion. The       hemorrhoids were Grade I (internal hemorrhoids that do not prolapse). Impression:            - Diverticulosis in the sigmoid colon.                        - Non-bleeding internal hemorrhoids.                        - No specimens collected. Recommendation:        - Discharge patient to home.                        - Resume previous diet.                        - Repeat flexible sigmoidoscopy in 10 years for                         screening purposes. Procedure Code(s):     --- Professional ---                        [phone], Sigmoidoscopy, flexible; diagnostic, including                         collection of specimen(s) by brushing or washing, when                         performed (separate procedure) Diagnosis Code(s):     --- Professional ---                        D37.4, Neoplasm of uncertain behavior of colon CPT copyright 2022 American Medical Association. All rights reserved. The codes documented in this report are preliminary and upon coder review may  be revised to meet current compliance requirements. Rogelia Copping MD, MD 02/23/2024 9:18:52 AM This report has been signed electronically. Number of Addenda: 0 Note Initiated On: 02/23/2024 8:52 AM Total Procedure Duration: 0 hours 5 minutes 46 seconds  Estimated Blood Loss:  Estimated blood loss: none.      [facility]

## 2024-02-23 NOTE — Transfer of Care (Signed)
 Immediate Anesthesia Transfer of Care Note  Patient: Brenda Thornton  Procedure(s) Performed: KINGSTON SIDE  Patient Location: PACU  Anesthesia Type:MAC  Level of Consciousness: awake, alert , and oriented  Airway & Oxygen Therapy: Patient Spontanous Breathing  Post-op Assessment: Report given to RN and Post -op Vital signs reviewed and stable  Post vital signs: stable  Last Vitals:  Vitals Value Taken Time  BP 126/79 02/23/24 09:20  Temp    Pulse 87 02/23/24 09:20  Resp 19 02/23/24 09:20  SpO2 100 % 02/23/24 09:20  Vitals shown include unfiled device data.  Last Pain:  Vitals:   02/23/24 0840  TempSrc: Tympanic  PainSc: 0-No pain      Patients Stated Pain Goal: 7 (02/23/24 0840)  Complications: No notable events documented.

## 2024-02-23 NOTE — Anesthesia Preprocedure Evaluation (Signed)
 Anesthesia Evaluation  Patient identified by MRN, date of birth, ID band Patient awake    Reviewed: Allergy & Precautions, NPO status , Patient's Chart, lab work & pertinent test results  History of Anesthesia Complications (+) PONV and history of anesthetic complications  Airway Mallampati: II  TM Distance: >3 FB Neck ROM: Full    Dental  (+) Partial Upper   Pulmonary neg pulmonary ROS, neg shortness of breath, neg sleep apnea, neg COPD, neg recent URI, Patient abstained from smoking.Not current smoker   Pulmonary exam normal breath sounds clear to auscultation       Cardiovascular Exercise Tolerance: Good METShypertension, Pt. on medications (-) angina (-) CAD and (-) Past MI (-) dysrhythmias  Rhythm:Regular Rate:Normal - Systolic murmurs    Neuro/Psych negative neurological ROS  negative psych ROS   GI/Hepatic ,neg GERD  ,,(+)     (-) substance abuse    Endo/Other  neg diabetes    Renal/GU negative Renal ROS     Musculoskeletal   Abdominal   Peds  Hematology   Anesthesia Other Findings Past Medical History: 04/13/2014: Acute hip pain No date: Arthritis No date: Complication of anesthesia     Comment:  hard to wake up 2011: History of hip replacement, total, left No date: History of kidney stones     Comment:  h/o No date: Hypertension 04/13/2014: Osteoarthritis of both hips 06/12/2020: Otalgia of right ear No date: PONV (postoperative nausea and vomiting)     Comment:  naueseated only  Reproductive/Obstetrics                              Anesthesia Physical Anesthesia Plan  ASA: 2  Anesthesia Plan: General   Post-op Pain Management: Minimal or no pain anticipated   Induction: Intravenous  PONV Risk Score and Plan: 4 or greater and Propofol  infusion and TIVA  Airway Management Planned: Nasal Cannula and Natural Airway  Additional Equipment: None  Intra-op Plan:    Post-operative Plan:   Informed Consent: I have reviewed the patients History and Physical, chart, labs and discussed the procedure including the risks, benefits and alternatives for the proposed anesthesia with the patient or authorized representative who has indicated his/her understanding and acceptance.     Dental advisory given  Plan Discussed with: CRNA and Surgeon  Anesthesia Plan Comments: (Discussed risks of anesthesia with patient, including possibility of difficulty with spontaneous ventilation under anesthesia necessitating airway intervention, PONV, and rare risks such as cardiac or respiratory or neurological events, and allergic reactions. Discussed the role of CRNA in patient's perioperative care. Patient understands.)        Anesthesia Quick Evaluation

## 2024-02-23 NOTE — Anesthesia Postprocedure Evaluation (Signed)
 Anesthesia Post Note  Patient: Brenda Thornton  Procedure(s) Performed: KINGSTON SIDE  Patient location during evaluation: Endoscopy Anesthesia Type: General Level of consciousness: awake and alert Pain management: pain level controlled Vital Signs Assessment: post-procedure vital signs reviewed and stable Respiratory status: spontaneous breathing, nonlabored ventilation, respiratory function stable and patient connected to nasal cannula oxygen Cardiovascular status: blood pressure returned to baseline and stable Postop Assessment: no apparent nausea or vomiting Anesthetic complications: no   No notable events documented.   Last Vitals:  Vitals:   02/23/24 0931 02/23/24 0940  BP: 125/78 133/79  Pulse: 85 76  Resp: 19 20  Temp:    SpO2: 100% 100%    Last Pain:  Vitals:   02/23/24 0931  TempSrc:   PainSc: 0-No pain                 Prentice Murphy

## 2024-04-01 DIAGNOSIS — M25551 Pain in right hip: Secondary | ICD-10-CM | POA: Diagnosis not present

## 2024-04-01 DIAGNOSIS — M5410 Radiculopathy, site unspecified: Secondary | ICD-10-CM | POA: Diagnosis not present

## 2024-04-26 ENCOUNTER — Other Ambulatory Visit: Payer: Self-pay | Admitting: Family Medicine

## 2024-04-26 DIAGNOSIS — Z1231 Encounter for screening mammogram for malignant neoplasm of breast: Secondary | ICD-10-CM

## 2024-06-07 ENCOUNTER — Ambulatory Visit
Admission: RE | Admit: 2024-06-07 | Discharge: 2024-06-07 | Disposition: A | Source: Ambulatory Visit | Attending: Family Medicine | Admitting: Family Medicine

## 2024-06-07 DIAGNOSIS — Z1231 Encounter for screening mammogram for malignant neoplasm of breast: Secondary | ICD-10-CM | POA: Diagnosis present
